# Patient Record
Sex: Female | Born: 1980 | Race: White | Hispanic: No | Marital: Married | Smoking: Current some day smoker
Health system: Southern US, Community
[De-identification: ages and names within clinical notes are randomized; demographics above are authoritative.]

## PROBLEM LIST (undated history)

## (undated) DIAGNOSIS — K2970 Gastritis, unspecified, without bleeding: Secondary | ICD-10-CM

## (undated) DIAGNOSIS — N200 Calculus of kidney: Secondary | ICD-10-CM

## (undated) DIAGNOSIS — F909 Attention-deficit hyperactivity disorder, unspecified type: Secondary | ICD-10-CM

## (undated) DIAGNOSIS — F419 Anxiety disorder, unspecified: Secondary | ICD-10-CM

## (undated) DIAGNOSIS — E039 Hypothyroidism, unspecified: Secondary | ICD-10-CM

## (undated) DIAGNOSIS — E7800 Pure hypercholesterolemia, unspecified: Secondary | ICD-10-CM

## (undated) DIAGNOSIS — F319 Bipolar disorder, unspecified: Secondary | ICD-10-CM

## (undated) DIAGNOSIS — G43909 Migraine, unspecified, not intractable, without status migrainosus: Secondary | ICD-10-CM

## (undated) HISTORY — DX: Calculus of kidney: N20.0

## (undated) HISTORY — PX: EXPLORATORY LAPAROTOMY: SUR591

## (undated) HISTORY — DX: Hypothyroidism, unspecified: E03.9

## (undated) HISTORY — DX: Anxiety disorder, unspecified: F41.9

## (undated) HISTORY — PX: DILITATION & CURRETTAGE/HYSTROSCOPY WITH ESSURE: SHX5573

## (undated) HISTORY — PX: TUBAL LIGATION: SHX77

## (undated) HISTORY — DX: Migraine, unspecified, not intractable, without status migrainosus: G43.909

## (undated) HISTORY — PX: ABLATION COLPOCLESIS: SHX1118

## (undated) HISTORY — DX: Bipolar disorder, unspecified: F31.9

## (undated) HISTORY — PX: OTHER SURGICAL HISTORY: SHX169

## (undated) HISTORY — DX: Gastritis, unspecified, without bleeding: K29.70

## (undated) HISTORY — PX: CHOLECYSTECTOMY: SHX55

---

## 2002-05-11 ENCOUNTER — Emergency Department (HOSPITAL_COMMUNITY): Admission: EM | Admit: 2002-05-11 | Discharge: 2002-05-11 | Payer: Self-pay | Admitting: Emergency Medicine

## 2002-05-23 ENCOUNTER — Inpatient Hospital Stay (HOSPITAL_COMMUNITY): Admission: AD | Admit: 2002-05-23 | Discharge: 2002-05-23 | Payer: Self-pay | Admitting: Family Medicine

## 2002-05-23 ENCOUNTER — Encounter: Payer: Self-pay | Admitting: Obstetrics and Gynecology

## 2007-02-09 ENCOUNTER — Emergency Department (HOSPITAL_COMMUNITY): Admission: EM | Admit: 2007-02-09 | Discharge: 2007-02-09 | Payer: Self-pay | Admitting: Emergency Medicine

## 2007-02-13 ENCOUNTER — Emergency Department (HOSPITAL_COMMUNITY): Admission: EM | Admit: 2007-02-13 | Discharge: 2007-02-13 | Payer: Self-pay | Admitting: Emergency Medicine

## 2007-05-06 ENCOUNTER — Emergency Department (HOSPITAL_COMMUNITY): Admission: EM | Admit: 2007-05-06 | Discharge: 2007-05-06 | Payer: Self-pay | Admitting: Emergency Medicine

## 2007-06-24 ENCOUNTER — Emergency Department (HOSPITAL_COMMUNITY): Admission: EM | Admit: 2007-06-24 | Discharge: 2007-06-24 | Payer: Self-pay | Admitting: Emergency Medicine

## 2007-06-29 ENCOUNTER — Other Ambulatory Visit: Payer: Self-pay | Admitting: Emergency Medicine

## 2007-06-29 ENCOUNTER — Inpatient Hospital Stay (HOSPITAL_COMMUNITY): Admission: RE | Admit: 2007-06-29 | Discharge: 2007-07-02 | Payer: Self-pay | Admitting: Psychiatry

## 2007-06-29 ENCOUNTER — Ambulatory Visit: Payer: Self-pay | Admitting: Psychiatry

## 2007-07-02 ENCOUNTER — Emergency Department (HOSPITAL_COMMUNITY): Admission: EM | Admit: 2007-07-02 | Discharge: 2007-07-03 | Payer: Self-pay | Admitting: Emergency Medicine

## 2007-07-07 ENCOUNTER — Inpatient Hospital Stay (HOSPITAL_COMMUNITY): Admission: RE | Admit: 2007-07-07 | Discharge: 2007-07-12 | Payer: Self-pay | Admitting: *Deleted

## 2007-08-03 ENCOUNTER — Emergency Department (HOSPITAL_COMMUNITY): Admission: EM | Admit: 2007-08-03 | Discharge: 2007-08-03 | Payer: Self-pay | Admitting: Emergency Medicine

## 2007-08-26 ENCOUNTER — Inpatient Hospital Stay (HOSPITAL_COMMUNITY): Admission: AD | Admit: 2007-08-26 | Discharge: 2007-08-26 | Payer: Self-pay | Admitting: Obstetrics & Gynecology

## 2007-10-03 ENCOUNTER — Inpatient Hospital Stay (HOSPITAL_COMMUNITY): Admission: AD | Admit: 2007-10-03 | Discharge: 2007-10-03 | Payer: Self-pay | Admitting: Obstetrics and Gynecology

## 2008-11-13 IMAGING — US US PELVIS COMPLETE MODIFY
1 series · 13 of 25 positions shown · non-contrast
Comparison: CT abdomen and pelvis 08/03/07.

CLINICAL DATA: Pelvic pain and cramping.  Fever.  IUD.
 TRANSABDOMINAL AND TRANSVAGINAL PELVIC ULTRASOUND:
TECHNIQUE: Both transabdominal and transvaginal ultrasound examinations of the pelvis were performed including evaluation of the uterus, ovaries, adnexal regions, and pelvic cul-de-sac.

[Series 1: us pelvis complete modify · 0.28mm/px · 13 of 55 slices shown]
[im 1/55]
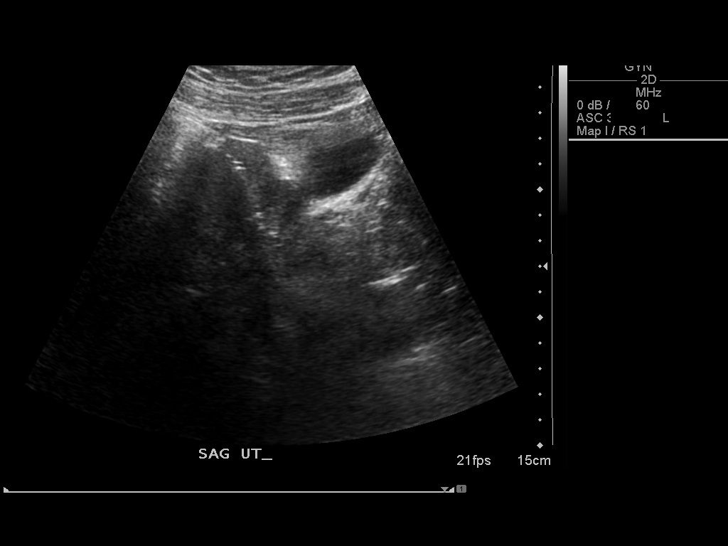
[im 5/55]
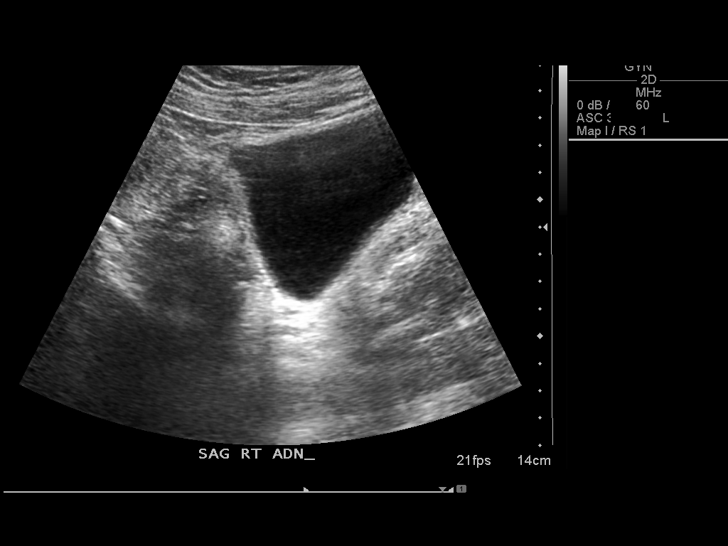
[im 10/55]
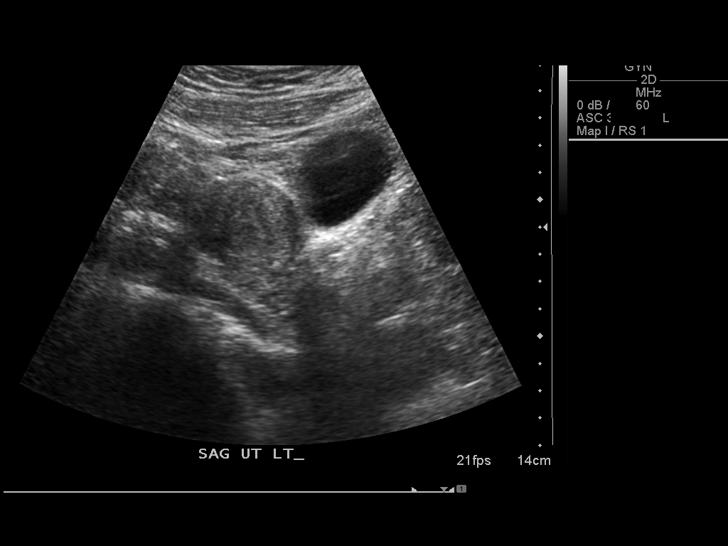
[im 14/55]
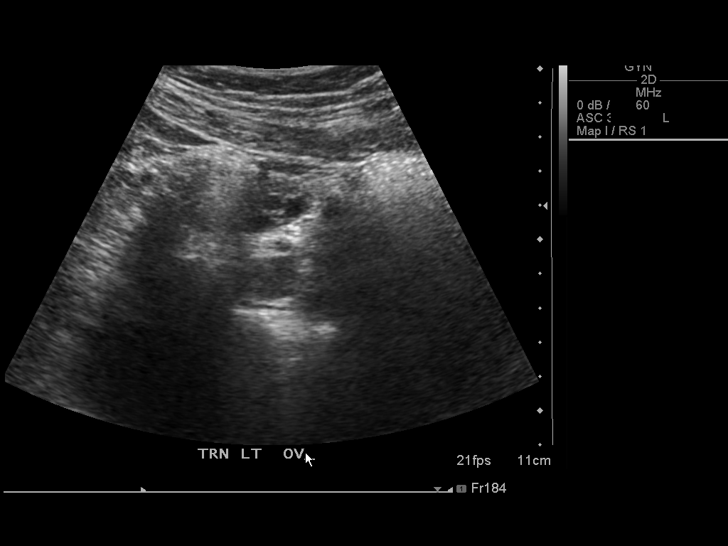
[im 19/55]
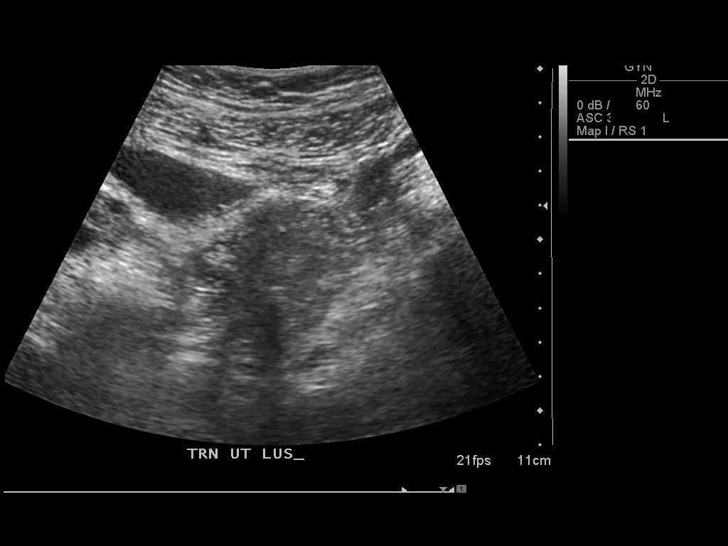
[im 23/55]
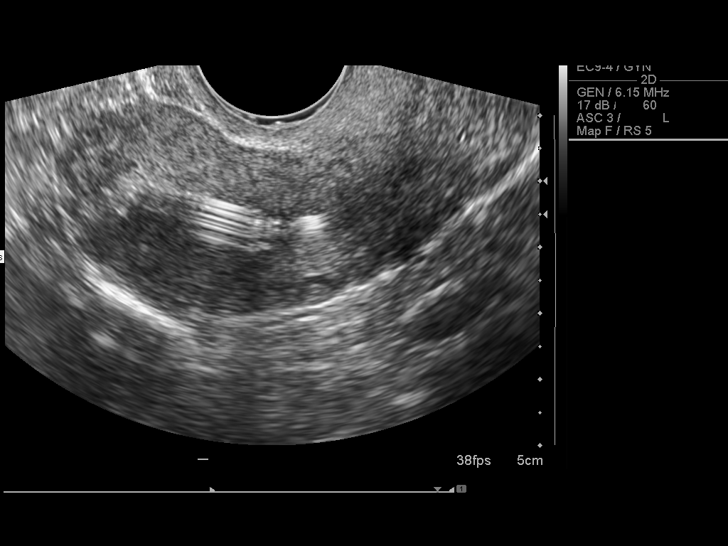
[im 28/55]
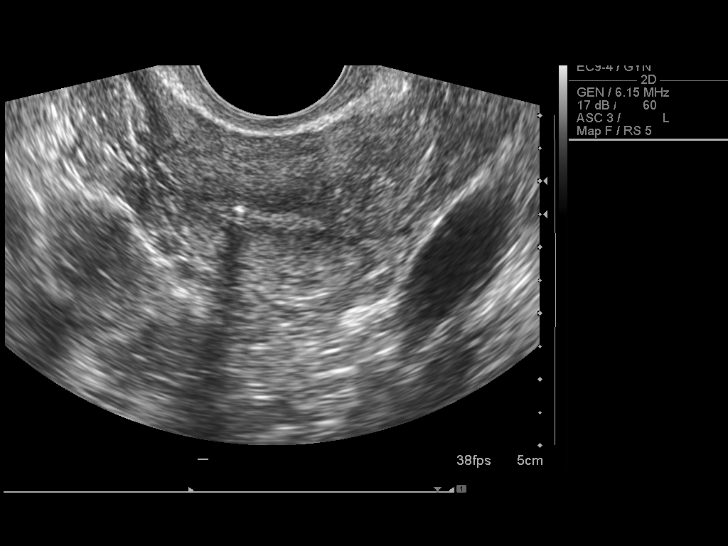
[im 32/55]
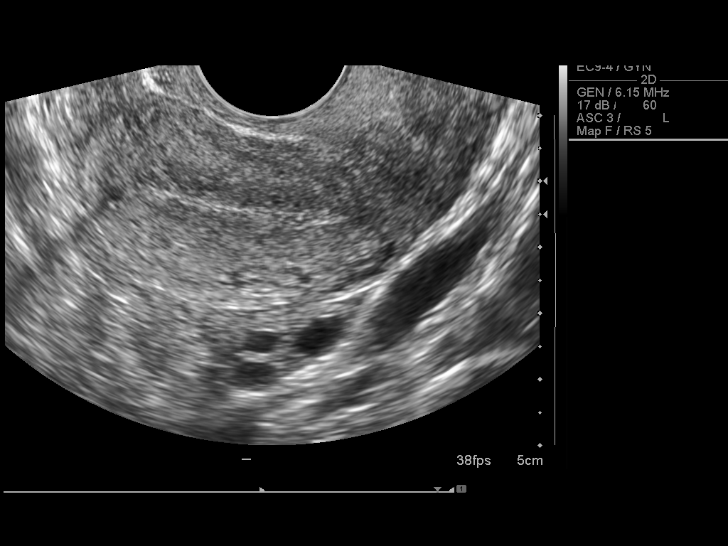
[im 37/55]
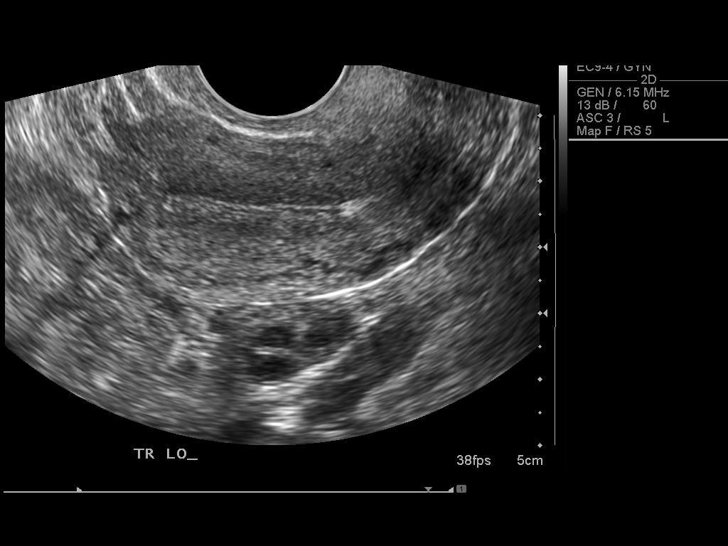
[im 41/55]
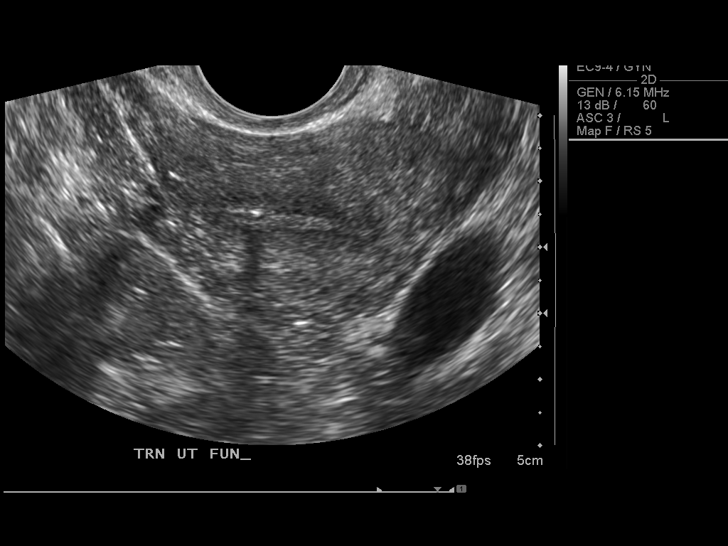
[im 46/55]
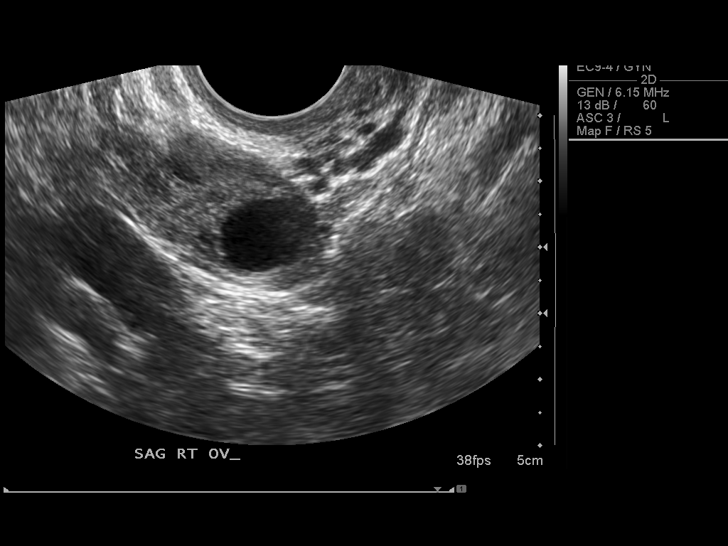
[im 50/55]
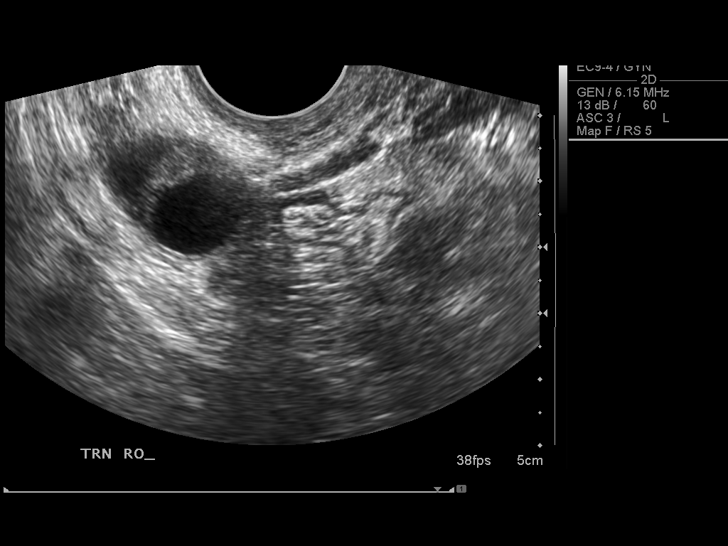
[im 55/55]
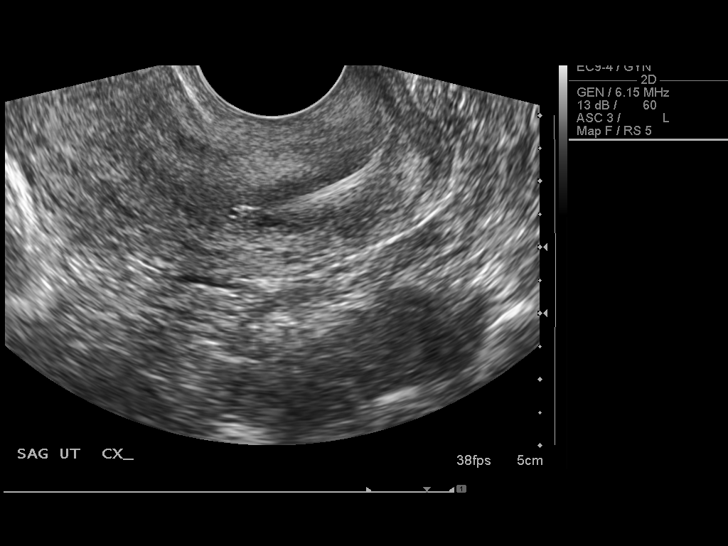

[13 of 25 positions shown; findings below may reference images not displayed]

FINDINGS: The uterus measures approximately 7.2 x 3.3 x 4.6 cm.  Intrauterine device is noted.  The distal end of the echogenic component of this IUD is 6 mm from the external os.  No endometrial thickening or myometrial abnormality is apparent.  The IUD was noted to be thinning the fundal endometrium on the prior CT, although this is not well demonstrated by the current examination.  
 There is no free pelvic fluid.  Both ovaries appear normal, measuring 3.0 x 1.9 x 2.0 cm on the right and 3.2 x 1.3 x 2.0 cm on the left.
IMPRESSION: 1.  No acute pelvic findings are demonstrated.  Both ovaries appear normal.  
 2.  No endometrial abnormality is demonstrated.  Fundal myometrial thinning noted on prior CT is not well demonstrated by this examination.  Please refer to prior CT report; IUD migration is not excluded by this examination.

## 2010-02-16 ENCOUNTER — Ambulatory Visit: Payer: Self-pay | Admitting: Radiology

## 2010-02-16 ENCOUNTER — Emergency Department (HOSPITAL_BASED_OUTPATIENT_CLINIC_OR_DEPARTMENT_OTHER): Admission: EM | Admit: 2010-02-16 | Discharge: 2010-02-16 | Payer: Self-pay | Admitting: Emergency Medicine

## 2010-03-08 ENCOUNTER — Ambulatory Visit: Payer: Self-pay | Admitting: Diagnostic Radiology

## 2010-03-08 ENCOUNTER — Observation Stay (HOSPITAL_COMMUNITY): Admission: EM | Admit: 2010-03-08 | Discharge: 2010-03-09 | Payer: Self-pay | Admitting: Internal Medicine

## 2010-03-08 ENCOUNTER — Encounter: Payer: Self-pay | Admitting: Emergency Medicine

## 2010-03-28 ENCOUNTER — Ambulatory Visit (HOSPITAL_COMMUNITY): Payer: Self-pay | Admitting: Psychiatry

## 2010-04-08 ENCOUNTER — Ambulatory Visit (HOSPITAL_COMMUNITY): Payer: Self-pay | Admitting: Licensed Clinical Social Worker

## 2010-04-16 ENCOUNTER — Ambulatory Visit (HOSPITAL_COMMUNITY): Payer: Self-pay | Admitting: Licensed Clinical Social Worker

## 2010-04-23 ENCOUNTER — Ambulatory Visit (HOSPITAL_COMMUNITY): Payer: Self-pay | Admitting: Licensed Clinical Social Worker

## 2010-05-01 ENCOUNTER — Ambulatory Visit (HOSPITAL_COMMUNITY): Payer: Self-pay | Admitting: Psychiatry

## 2010-05-03 ENCOUNTER — Ambulatory Visit (HOSPITAL_COMMUNITY): Payer: Self-pay | Admitting: Licensed Clinical Social Worker

## 2010-05-06 ENCOUNTER — Ambulatory Visit (HOSPITAL_COMMUNITY): Payer: Self-pay | Admitting: Licensed Clinical Social Worker

## 2010-05-30 ENCOUNTER — Ambulatory Visit (HOSPITAL_COMMUNITY): Payer: Self-pay | Admitting: Licensed Clinical Social Worker

## 2010-06-10 ENCOUNTER — Ambulatory Visit (HOSPITAL_COMMUNITY): Payer: Self-pay | Admitting: Licensed Clinical Social Worker

## 2010-06-11 ENCOUNTER — Ambulatory Visit (HOSPITAL_COMMUNITY): Payer: Self-pay | Admitting: Physician Assistant

## 2010-06-12 ENCOUNTER — Ambulatory Visit (HOSPITAL_COMMUNITY): Payer: Self-pay | Admitting: Licensed Clinical Social Worker

## 2010-06-13 ENCOUNTER — Ambulatory Visit (HOSPITAL_COMMUNITY): Payer: Self-pay | Admitting: Psychiatry

## 2010-07-04 ENCOUNTER — Ambulatory Visit (HOSPITAL_COMMUNITY)
Admission: RE | Admit: 2010-07-04 | Discharge: 2010-07-04 | Payer: Self-pay | Source: Home / Self Care | Attending: Licensed Clinical Social Worker | Admitting: Licensed Clinical Social Worker

## 2010-07-11 ENCOUNTER — Ambulatory Visit (HOSPITAL_COMMUNITY)
Admission: RE | Admit: 2010-07-11 | Discharge: 2010-07-11 | Payer: Self-pay | Source: Home / Self Care | Attending: Licensed Clinical Social Worker | Admitting: Licensed Clinical Social Worker

## 2010-07-16 ENCOUNTER — Ambulatory Visit (HOSPITAL_COMMUNITY)
Admission: RE | Admit: 2010-07-16 | Discharge: 2010-07-16 | Payer: Self-pay | Source: Home / Self Care | Attending: Psychiatry | Admitting: Psychiatry

## 2010-07-18 ENCOUNTER — Ambulatory Visit (HOSPITAL_COMMUNITY)
Admission: RE | Admit: 2010-07-18 | Discharge: 2010-07-18 | Payer: Self-pay | Source: Home / Self Care | Attending: Licensed Clinical Social Worker | Admitting: Licensed Clinical Social Worker

## 2010-07-25 ENCOUNTER — Encounter (HOSPITAL_COMMUNITY): Admitting: Licensed Clinical Social Worker

## 2010-07-25 DIAGNOSIS — F319 Bipolar disorder, unspecified: Secondary | ICD-10-CM

## 2010-08-01 ENCOUNTER — Encounter (HOSPITAL_COMMUNITY): Admitting: Licensed Clinical Social Worker

## 2010-08-01 DIAGNOSIS — F319 Bipolar disorder, unspecified: Secondary | ICD-10-CM

## 2010-08-08 ENCOUNTER — Encounter (HOSPITAL_COMMUNITY): Admitting: Physician Assistant

## 2010-08-08 DIAGNOSIS — F988 Other specified behavioral and emotional disorders with onset usually occurring in childhood and adolescence: Secondary | ICD-10-CM

## 2010-08-08 DIAGNOSIS — F309 Manic episode, unspecified: Secondary | ICD-10-CM

## 2010-08-09 ENCOUNTER — Encounter (HOSPITAL_COMMUNITY): Admitting: Licensed Clinical Social Worker

## 2010-08-09 DIAGNOSIS — F319 Bipolar disorder, unspecified: Secondary | ICD-10-CM

## 2010-08-14 ENCOUNTER — Encounter (HOSPITAL_COMMUNITY): Admitting: Licensed Clinical Social Worker

## 2010-08-30 ENCOUNTER — Emergency Department (HOSPITAL_BASED_OUTPATIENT_CLINIC_OR_DEPARTMENT_OTHER)
Admission: EM | Admit: 2010-08-30 | Discharge: 2010-08-30 | Disposition: A | Discharge status: Home / Self Care | Attending: Emergency Medicine | Admitting: Emergency Medicine

## 2010-08-30 DIAGNOSIS — E0500 Thyrotoxicosis with diffuse goiter without thyrotoxic crisis or storm: Secondary | ICD-10-CM | POA: Insufficient documentation

## 2010-08-30 DIAGNOSIS — F17200 Nicotine dependence, unspecified, uncomplicated: Secondary | ICD-10-CM | POA: Insufficient documentation

## 2010-08-30 DIAGNOSIS — E039 Hypothyroidism, unspecified: Secondary | ICD-10-CM | POA: Insufficient documentation

## 2010-08-30 DIAGNOSIS — E7800 Pure hypercholesterolemia, unspecified: Secondary | ICD-10-CM | POA: Insufficient documentation

## 2010-08-30 DIAGNOSIS — F319 Bipolar disorder, unspecified: Secondary | ICD-10-CM | POA: Insufficient documentation

## 2010-09-03 ENCOUNTER — Encounter (HOSPITAL_COMMUNITY): Admitting: Licensed Clinical Social Worker

## 2010-09-04 ENCOUNTER — Encounter (HOSPITAL_COMMUNITY): Admitting: Licensed Clinical Social Worker

## 2010-09-04 DIAGNOSIS — F319 Bipolar disorder, unspecified: Secondary | ICD-10-CM

## 2010-09-05 LAB — DIFFERENTIAL
Basophils Absolute: 0.1 10*3/uL (ref 0.0–0.1)
Basophils Relative: 1 % (ref 0–1)
Eosinophils Absolute: 0.1 10*3/uL (ref 0.0–0.7)
Eosinophils Relative: 1 % (ref 0–5)
Monocytes Absolute: 0.9 10*3/uL (ref 0.1–1.0)
Monocytes Relative: 7 % (ref 3–12)
Neutro Abs: 8.3 10*3/uL — ABNORMAL HIGH (ref 1.7–7.7)

## 2010-09-05 LAB — CBC
HCT: 41.5 % (ref 36.0–46.0)
Hemoglobin: 12.8 g/dL (ref 12.0–15.0)
Hemoglobin: 14.4 g/dL (ref 12.0–15.0)
MCH: 32.8 pg (ref 26.0–34.0)
MCH: 33.3 pg (ref 26.0–34.0)
MCHC: 34.4 g/dL (ref 30.0–36.0)
MCHC: 34.7 g/dL (ref 30.0–36.0)
MCV: 95.3 fL (ref 78.0–100.0)
MCV: 95.9 fL (ref 78.0–100.0)
Platelets: 381 10*3/uL (ref 150–400)
RDW: 13 % (ref 11.5–15.5)

## 2010-09-05 LAB — BASIC METABOLIC PANEL
BUN: 7 mg/dL (ref 6–23)
CO2: 28 mEq/L (ref 19–32)
GFR calc non Af Amer: >60 mL/min (ref >60)
Glucose, Bld: 81 mg/dL (ref 70–99)
Potassium: 4.3 mEq/L (ref 3.5–5.1)

## 2010-09-05 LAB — URINALYSIS, ROUTINE W REFLEX MICROSCOPIC
Bilirubin Urine: NEGATIVE
Hgb urine dipstick: NEGATIVE
Ketones, ur: NEGATIVE mg/dL
Nitrite: NEGATIVE
pH: 6 (ref 5.0–8.0)

## 2010-09-05 LAB — PREGNANCY, URINE: Preg Test, Ur: NEGATIVE

## 2010-09-06 ENCOUNTER — Encounter (HOSPITAL_COMMUNITY): Admitting: Licensed Clinical Social Worker

## 2010-09-06 DIAGNOSIS — F319 Bipolar disorder, unspecified: Secondary | ICD-10-CM

## 2010-09-09 ENCOUNTER — Encounter (HOSPITAL_COMMUNITY): Admitting: Licensed Clinical Social Worker

## 2010-09-09 DIAGNOSIS — F319 Bipolar disorder, unspecified: Secondary | ICD-10-CM

## 2010-09-10 ENCOUNTER — Encounter (HOSPITAL_COMMUNITY): Admitting: Physician Assistant

## 2010-09-10 DIAGNOSIS — F3160 Bipolar disorder, current episode mixed, unspecified: Secondary | ICD-10-CM

## 2010-09-11 ENCOUNTER — Encounter (HOSPITAL_COMMUNITY): Admitting: Physician Assistant

## 2010-09-12 ENCOUNTER — Encounter (HOSPITAL_COMMUNITY): Admitting: Licensed Clinical Social Worker

## 2010-09-12 DIAGNOSIS — F319 Bipolar disorder, unspecified: Secondary | ICD-10-CM

## 2010-09-16 ENCOUNTER — Encounter (HOSPITAL_COMMUNITY): Admitting: Licensed Clinical Social Worker

## 2010-09-16 DIAGNOSIS — F319 Bipolar disorder, unspecified: Secondary | ICD-10-CM

## 2010-09-25 ENCOUNTER — Encounter (HOSPITAL_BASED_OUTPATIENT_CLINIC_OR_DEPARTMENT_OTHER): Admitting: Licensed Clinical Social Worker

## 2010-09-25 DIAGNOSIS — F319 Bipolar disorder, unspecified: Secondary | ICD-10-CM

## 2010-09-26 ENCOUNTER — Encounter (HOSPITAL_COMMUNITY): Admitting: Physician Assistant

## 2010-09-30 ENCOUNTER — Encounter (HOSPITAL_BASED_OUTPATIENT_CLINIC_OR_DEPARTMENT_OTHER): Admitting: Licensed Clinical Social Worker

## 2010-09-30 DIAGNOSIS — F319 Bipolar disorder, unspecified: Secondary | ICD-10-CM

## 2010-10-03 ENCOUNTER — Emergency Department (INDEPENDENT_AMBULATORY_CARE_PROVIDER_SITE_OTHER)

## 2010-10-03 ENCOUNTER — Emergency Department (HOSPITAL_BASED_OUTPATIENT_CLINIC_OR_DEPARTMENT_OTHER)
Admission: EM | Admit: 2010-10-03 | Discharge: 2010-10-03 | Disposition: A | Discharge status: Home / Self Care | Attending: Emergency Medicine | Admitting: Emergency Medicine

## 2010-10-03 DIAGNOSIS — X500XXA Overexertion from strenuous movement or load, initial encounter: Secondary | ICD-10-CM

## 2010-10-03 DIAGNOSIS — M25579 Pain in unspecified ankle and joints of unspecified foot: Secondary | ICD-10-CM

## 2010-10-03 DIAGNOSIS — F319 Bipolar disorder, unspecified: Secondary | ICD-10-CM | POA: Insufficient documentation

## 2010-10-03 DIAGNOSIS — F17200 Nicotine dependence, unspecified, uncomplicated: Secondary | ICD-10-CM | POA: Insufficient documentation

## 2010-10-03 DIAGNOSIS — Z79899 Other long term (current) drug therapy: Secondary | ICD-10-CM | POA: Insufficient documentation

## 2010-10-03 DIAGNOSIS — E7800 Pure hypercholesterolemia, unspecified: Secondary | ICD-10-CM | POA: Insufficient documentation

## 2010-10-08 ENCOUNTER — Encounter (HOSPITAL_COMMUNITY): Admitting: Physician Assistant

## 2010-10-08 DIAGNOSIS — F3110 Bipolar disorder, current episode manic without psychotic features, unspecified: Secondary | ICD-10-CM

## 2010-10-16 ENCOUNTER — Encounter (HOSPITAL_COMMUNITY): Admitting: Licensed Clinical Social Worker

## 2010-10-27 ENCOUNTER — Emergency Department (HOSPITAL_BASED_OUTPATIENT_CLINIC_OR_DEPARTMENT_OTHER)
Admission: EM | Admit: 2010-10-27 | Discharge: 2010-10-28 | Disposition: A | Discharge status: Home / Self Care | Attending: Emergency Medicine | Admitting: Emergency Medicine

## 2010-10-27 DIAGNOSIS — R112 Nausea with vomiting, unspecified: Secondary | ICD-10-CM | POA: Insufficient documentation

## 2010-10-27 DIAGNOSIS — W108XXA Fall (on) (from) other stairs and steps, initial encounter: Secondary | ICD-10-CM | POA: Insufficient documentation

## 2010-10-27 DIAGNOSIS — L989 Disorder of the skin and subcutaneous tissue, unspecified: Secondary | ICD-10-CM | POA: Insufficient documentation

## 2010-10-27 DIAGNOSIS — F17200 Nicotine dependence, unspecified, uncomplicated: Secondary | ICD-10-CM | POA: Insufficient documentation

## 2010-10-27 DIAGNOSIS — E7800 Pure hypercholesterolemia, unspecified: Secondary | ICD-10-CM | POA: Insufficient documentation

## 2010-10-27 DIAGNOSIS — F319 Bipolar disorder, unspecified: Secondary | ICD-10-CM | POA: Insufficient documentation

## 2010-10-27 DIAGNOSIS — Z79899 Other long term (current) drug therapy: Secondary | ICD-10-CM | POA: Insufficient documentation

## 2010-10-27 DIAGNOSIS — S060X0A Concussion without loss of consciousness, initial encounter: Secondary | ICD-10-CM | POA: Insufficient documentation

## 2010-10-28 ENCOUNTER — Emergency Department (INDEPENDENT_AMBULATORY_CARE_PROVIDER_SITE_OTHER)

## 2010-10-28 DIAGNOSIS — W108XXA Fall (on) (from) other stairs and steps, initial encounter: Secondary | ICD-10-CM

## 2010-10-28 DIAGNOSIS — M549 Dorsalgia, unspecified: Secondary | ICD-10-CM

## 2010-10-28 DIAGNOSIS — R112 Nausea with vomiting, unspecified: Secondary | ICD-10-CM

## 2010-10-28 DIAGNOSIS — R51 Headache: Secondary | ICD-10-CM

## 2010-10-28 DIAGNOSIS — F29 Unspecified psychosis not due to a substance or known physiological condition: Secondary | ICD-10-CM

## 2010-10-30 ENCOUNTER — Encounter (HOSPITAL_COMMUNITY): Admitting: Physician Assistant

## 2010-11-05 NOTE — Discharge Summary (Signed)
NAME:  Christy Obrien, Christy Obrien NO.:  [id number]   MEDICAL RECORD NO.:  [id number]          PATIENT TYPE:  IPS   LOCATION:  0302                          FACILITY:  [facility]   PHYSICIAN:  Jasmine Pang, M.D. DATE OF BIRTH:  09/24/1980   DATE OF ADMISSION:  07/07/2007  DATE OF DISCHARGE:  07/12/2007                               DISCHARGE SUMMARY   IDENTIFICATION:  This is a 30 year old married white female from [place], [place], who was admitted on a voluntary basis on July 07, 2007.  She had just been discharged from our unit 1 week ago.   HISTORY OF PRESENT ILLNESS:  The patient reports a history of confusion.  She states she cannot remember who brought her here and why she is here.  She states she is hearing voices and  is suicidal.  She denies any  specific stressors.  She states that she did speak to her husband the  day prior to this admission.  He is currently deployed in the [employer] and  stationed at [employer].  The patient states she just wants to go to  sleep.  Her appetite has been decreased since.  She denies any alcohol  or drug use.  This is the second visit for this patient.  She has  recently discharged from our facility approximately 1 week ago.  She has  a history of bipolar disorder and was told she has a history of  conversion disorder.  She also has a history of overdosing on her  Neurontin in the past.  She has a history of hypothyroidism.  She is  currently on Abilify 10 mg at bedtime, Klonopin 0.5 mg a.m. and 1 mg at  bedtime, Synthroid 150 mcg at bedtime, Lexapro 10 mg at bedtime,  multivitamin daily, and Estra-C 1 daily.   She is allergic to LAMICTAL, CONTRAST DYE, and BARIUM SULFATE.   PHYSICAL FINDINGS:  Physical exam was grossly within normal limits, but  there were no acute medical or physical problems.   Her T4 was 1.13.  T3 was 96.2.  TSH was elevated at 19.230.  Urine drug  screen was negative.  Glucose was 104.  Urinalysis  showed 2 WBCs.   HOSPITAL COURSE:  Upon admission, the patient was started on Abilify 10  mg p.o. q.h.s., Klonopin 0.5 mg p.o. q.a.m. and 1 mg q.h.s., Synthroid  150 mcg p.o. q.h.s., Lexapro 10 mg p.o. q.h.s., multivitamin 1 daily,  and Estra-C 1 daily.  She was also started on Ambien 5 mg p.o. q.h.s.  p.r.n.  On July 09, 2007, Abilify was increased to 15 mg p.o. daily.  On July 10, 2007, Abilify was decreased back to 10 mg p.o. daily due  to some EPS symptoms.  She was given Benadryl 25 mg now and then p.o.  q.6 hours p.r.n. EPS.  On July 11, 2007, the patient requested that  her Lexapro to be discontinued and the patient tolerated these well with  no significant side effects.  Initially, upon meeting with me she was  anxious.  She stated I do not know to all  our questions.  She was  confused.  She had poor eye contact.  Speech was soft and slow.  She  states she felt tired and confused.  She says she was told she had a  conversion disorder, which is why she had been passing out.  She also  was not able to participate appropriately in unit therapeutic groups and  activities to begin with due to her mental status.  On July 09, 2007,  she was more irritable and angry.  She wanted to go home.  She was angry  because her mother is dissociating herself from [place].  She signed a  72-hour request for discharge.  On July 10, 2007, the patient  continued to be irritable and requesting to go home.  She was angry at  her mother and continued to state she was not going to have anything to  do with her mother.  Her mother, however, has her son and she was  advised that she was going to have some contact with her revolving  around this.  On July 11, 2007, her mental status had improved.  She  states she  thought it has been the wrong decision to come here.  She  did not like the group therapies  once she began to participate in them,  she did not feel they were helpful.  She  refused her Lexapro because she  began to feel very  manic.  She states that when this happened, she  discontinued her Lexapro at home and there was a family session on the  phone with her mother.  The patient's mother spoke about concerns  related to the patient not having supports and being alone.  Upon  discharge, the patient stated she would not go and live with her mother,  even though her mother was offering the support.  The patient's mother  also spoke the patient's history of bipolar and recent onset of visual  hallucinations.  The patient stated she is not experiencing  hallucinations anymore.  Mother expressed a desire for the patient to be  able to live on her own  and hold a job.  They discussed her situation  (her husband is in the [employer]).  She would not talk to her mother and  became tearful when she heard her 49-year-old son speak.  Mother tried to  explain that she had withdrawn somewhat from the picture because she  felt the patient was speaking to her instead of talking to staff and  going to groups.  On July 12, 2007, mental status had improved  markedly from admission status.  The patient was friendly and  cooperative with good eye contact.  Sleep was good.  Appetite good.  Mood was euthymic.  Affect consistent with mood.  There was no suicidal  or homicidal ideation.  No thoughts of self-injurious behavior.  No  auditory or visual hallucinations.  No paranoia or delusions.  Thoughts  were logical and goal-directed.  Thought content, no predominant theme.  Cognitive was grossly back to baseline.  She wanted to go home.  She had  been talking with her mother some and felt safe for discharge.  Her  parents feel it is safe for her to be discharged.  Mother was coming to  pick her up.   DISCHARGE DIAGNOSES:  Axis I:  Bipolar disorder, not otherwise  specified.  Axis II:  Features of borderline personality disorder.  Axis III:  Hypothyroidism.  Axis IV:  Severe  (  psychosocial problems dealing with her husband's  deployment and also problems with husband's family in addition to  problems with her own family and burden of psychiatric illness).  Axis V: Global assessment of function was 50 upon discharge.  Global  assessment of function was 30 upon admission.  Global assessment of  function was 60, highest past year.   DISCHARGE/PLAN:  There were no specific activity level or dietary  restrictions.   POST HOSPITAL CARE PLANS:  The patient will be seen at [facility] on January 20th at 10:35 a.m.  She will follow up with  [facility] at [facility] in 4-6 weeks.   DISCHARGE MEDICATIONS:  1. Levothyroxine 150 mcg one daily.  2. Klonopin 0.5 mg 1 in the a.m. and 2 at bedtime.  3. Lexapro 10 mg is stopped.  4. Abilify 10 mg at bedtime.      Jasmine Pang, M.D.  Electronically Signed     BHS/MEDQ  D:  07/12/2007  T:  07/12/2007  Job:  [id number]

## 2010-11-05 NOTE — H&P (Signed)
NAME:  Christy Obrien, Christy Obrien NO.:  [id number]   MEDICAL RECORD NO.:  [id number]          PATIENT TYPE:  IPS   LOCATION:  0302                          FACILITY:  [facility]   PHYSICIAN:  Jasmine Pang, M.D. DATE OF BIRTH:  1981/01/27   DATE OF ADMISSION:  07/07/2007  DATE OF DISCHARGE:                       PSYCHIATRIC ADMISSION ASSESSMENT   HISTORY OF PRESENT ILLNESS:  The patient reports a history of confusion.  She cannot remember who brought her here, why she is here.  She is  hearing voices, suicidal.  She denies any specific stressors.  She  states that she did speak to her husband the day prior to this admission  who was currently deployed in the [employer] and his stationed at [employer].  The patient states that she just wants to go to sleep.  Her appetite  has been decreased and denies any alcohol or drug use.   PAST PSYCHIATRIC HISTORY:  This is the second visit.  This patient was  recently discharged from our facility approximately 1 week ago.  Has a  history bipolar disorder and was told she has a history of conversion  disorder and history of overdosing on her Neurontin in the past.   SOCIAL HISTORY:  Married female.  Her husband is in the [employer] and  currently stationed in [place], [place].  She has a 54-year-old son.  The son is with the husband's family.  She otherwise lives with her  child.  The patient is unemployed.  Denies any alcohol or drug use.   FAMILY HISTORY:  None.   PRIMARY CARE PHYSICIAN:  [facility] in [place].   PAST MEDICAL HISTORY:  Hypothyroidism.   MEDICATIONS:  1. Abilify 10 mg at bedtime.  2. Klonopin 0.5 mg daily.  3. Klonopin 1 mg at bedtime.  4. Synthroid 150 mcg at bedtime.  5. Lexapro 10 at bedtime.  6. Multivitamin.  7. Estra-C one daily.   ALLERGIES:  LAMICTAL, CONTRAST DYE, BARIUM SULFATE.   REVIEW OF SYSTEMS:  Pertinent for appetite changes, insomnia,  depression, questionable hallucinations, and a  questionable seizure  disorder.   PHYSICAL EXAMINATION:  VITAL SIGNS:  Temperature is 98, heart rate 93,  respirations 80, blood pressure is 113/70.  She is 180 pounds, 5 feet  tall.  GENERAL:  This is a short statured female dressed in scrubs that say  [employer] Wife on the sweat shirt top.  She is disheveled.  Poor eye contact.  HEENT:  Atraumatic.  NECK:  Supple.  No thyromegaly.  CHEST:  Clear.  BREASTS:  Exam was deferred.  HEART:  Regular rate and rhythm.  No murmurs, gallops, rubs.  ABDOMEN:  Soft, nontender abdomen.  PELVIC AND GU:  Deferred.  EXTREMITIES:  The patient moves all extremities.  There is no clubbing,  no edema.  5+ against resistance.  SKIN:  Warm and dry without rashes or lacerations that were noted.  NEUROLOGIC:  Intact.  Her memory is poor.  She responds easily to her  name.  Was a little more interactive with her physical exam than with  her interview.  There is no  tremor, no tics noted.   LABORATORY DATA:  Her T4 is 1.13, T3 is 96.2, TSH is elevated at 19.230.  Urine drug screen is negative.  Glucose 104.  Urinalysis shows  2 WBCs.   MENTAL STATUS EXAM:  Again, during the interview, the patient was alert  and cooperative but with poor eye contact and with psychomotor  retardation.  Her speech is soft-spoken, looking down at the floor.  Offers little in regards to her history.  Her mood is tired and  confused.  The patient's affect is depressed and also confused in  regards to history.  Thought process:  There is questionable  hallucinations or delusions, as she did state on her initial assessment  that she had thought that her child was dead even though he is alive and  also hearing people walking around her house.  Cognitive function:  Her  memory is poor.  Her judgment and insight is poor.  Concentration is  decreased.   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   AXIS II:  Deferred.   AXIS III:  Hypothyroidism.   AXIS IV:   Psychosocial problems dealing with her husband's deployment  and also problems with husband's family.   AXIS V:  Current is 30.   Contract for safety.  We will stabilize mood, thinking.  Will resume her  medications.  The patient will need followup in regards to her thyroid  testing.  That was discussed with the patient, and the patient will  followup in 4-6 weeks.  We will increase coping skills.  Will contact  mother for background information, what prompted admission to the  facility.  Her tentative length of stay is 4-5 days.      Landry Corporal, N.P.      Jasmine Pang, M.D.  Electronically Signed    JO/MEDQ  D:  07/08/2007  T:  07/08/2007  Job:  [id number]

## 2010-11-05 NOTE — H&P (Signed)
NAME:  Christy Obrien, Christy Obrien NO.:  [id number]   MEDICAL RECORD NO.:  [id number]          PATIENT TYPE:  IPS   LOCATION:  0503                          FACILITY:  [facility]   PHYSICIAN:  Jasmine Pang, M.D. DATE OF BIRTH:  07-03-1980   DATE OF ADMISSION:  06/29/2007  DATE OF DISCHARGE:                       PSYCHIATRIC ADMISSION ASSESSMENT   REASON FOR ADMISSION:  A 30 year old female voluntarily admitted June 29, 2007.   HISTORY OF PRESENT ILLNESS:  The patient is here as she states for a  suicide attempt.  She states she was unable to keep living the way she  was.  She overdosed on Monday and overdosed on Neurontin, Unisom and  Klonopin.  States that she was taking a bottle caps full of her  medications over period of time.  These were all her medications.  She  reports that she is feeling very, very depressed.  She is a single mom  as her husband is currently deployed for 6 to 7 months in the [employer].  She reports having panic attacks.  Getting to the point where she cannot  breathe and she passes out, problems sleeping with a satisfactory  appetite.  She reports somewhat of a rocky relationship with her  husband, again who is are currently deployed at this time up in [place], [place].   PAST PSYCHIATRIC HISTORY:  This is her first admission to [facility].  Has a history bipolar disorder.  She sees a Therapist, sports  at [facility].  She reports of overdosing in the past, but  states her husband kept waking her up and she was not hospitalized at  that time.  She in the past has been on Trileptal but had an adverse  effect to that medication.   SOCIAL HISTORY:  Her husband is currently in the military in [place]  and is deployed for 6 to 7 months.  She is married for 5 years, has 41-  year-old son.  Her son is currently with her mother.  The patient has  been taking online college courses.   FAMILY HISTORY:  Grandmother with  depression.  Also reports her mother  has depression but not will admit to it.  alcohol, drug history denies  any drug use.  Refers to some episodes of drinking at times, going out 2  to 3 times a week and then having episodes where she does not have any  drinks for several months.   PRIMARY CARE PHYSICIAN:  None known.   MEDICAL PROBLEMS:  Hypothyroidism.   CURRENT MEDICATIONS:  Current medications are Lexapro.  She takes the  medications off and on for depression and when she finds herself  getting hypomanic she stops her Lexapro, takes Klonopin t.i.d. and  Abilify 5 mg at bedtime and levothyroxine 150 mcg.   DRUG ALLERGIES:  ARE LAMICTAL, REPORTS A RASH, SEROQUEL AND TOPAMAX ALSO  REPORTS RASH.   PHYSICAL EXAMINATION:  GENERAL:  on physical exam, this is a young  female who is mildly unsteady on her feet, disheveled and dressed in  hospital scrubs.  She was fully assessed  at [facility] emergency  department.  VITAL SIGNS:  Temperature 97, 66 heart rate, 18 respirations, blood  pressure is 94/66, 100% saturated, 5 feet 1 inch tall, 175 pounds.   LABORATORY DATA:  Her acetaminophen level less than 5.  Pregnancy test  is negative.  Urinalysis is negative.  Her BUN is 4.  CBC within normal  limits and her urine drug screen is positive for benzodiazepines.   MENTAL STATUS EXAM:  She is fully alert, cooperative.  She is currently  dressed in scrubs, somewhat disheveled, good eye contact.  Her speech is  clear, normal pace and tone.  The patient's mood is depressed.  She is  angry about the [employer].  Her affect is constricted.  Thought process  no evidence of any thought disorder.  Her answers are goal-directed,  coherent.  Cognitive function intact.  Her memory is good.  Judgment and  insight fair.  Concentration is intact.   DIAGNOSES:  AXIS I:  Bipolar disorder, mixed.  AXIS II:  Deferred.  AXIS III:  Hypothyroidism.  AXIS IV:  Other psychosocial problems.  AXIS V:  Current  is 30.  Contracted for safety.   PLAN:  Will stabilize her mood and thinking.  Will clarify her dose of  her levothyroxine; will also obtain a TSH.  The patient is to increase  coping skills.  She may need some individual therapy.  We will continue  with her Abilify at this time.  Patient is also considered to be a fall  risk.  Her tentative length of stay is 4 to 5 days.      Landry Corporal, N.P.      Jasmine Pang, M.D.  Electronically Signed    JO/MEDQ  D:  06/30/2007  T:  06/30/2007  Job:  [id number]

## 2010-11-06 ENCOUNTER — Emergency Department (HOSPITAL_COMMUNITY): Payer: Self-pay

## 2010-11-06 ENCOUNTER — Emergency Department (HOSPITAL_COMMUNITY)
Admission: EM | Admit: 2010-11-06 | Discharge: 2010-11-07 | Disposition: A | Discharge status: Home / Self Care | Payer: Self-pay | Attending: Emergency Medicine | Admitting: Emergency Medicine

## 2010-11-06 DIAGNOSIS — F319 Bipolar disorder, unspecified: Secondary | ICD-10-CM | POA: Insufficient documentation

## 2010-11-06 DIAGNOSIS — E039 Hypothyroidism, unspecified: Secondary | ICD-10-CM | POA: Insufficient documentation

## 2010-11-06 DIAGNOSIS — Z9089 Acquired absence of other organs: Secondary | ICD-10-CM | POA: Insufficient documentation

## 2010-11-06 DIAGNOSIS — E785 Hyperlipidemia, unspecified: Secondary | ICD-10-CM | POA: Insufficient documentation

## 2010-11-06 DIAGNOSIS — R079 Chest pain, unspecified: Secondary | ICD-10-CM | POA: Insufficient documentation

## 2010-11-06 DIAGNOSIS — Z79899 Other long term (current) drug therapy: Secondary | ICD-10-CM | POA: Insufficient documentation

## 2010-11-06 LAB — BASIC METABOLIC PANEL
BUN: 6 mg/dL (ref 6–23)
CO2: 25 mEq/L (ref 19–32)
Calcium: 9.1 mg/dL (ref 8.4–10.5)
Chloride: 101 mEq/L (ref 96–112)
Creatinine, Ser: 0.73 mg/dL (ref 0.4–1.2)
Glucose, Bld: 78 mg/dL (ref 70–99)

## 2010-11-06 LAB — CBC
HCT: 42.5 % (ref 36.0–46.0)
MCH: 31.7 pg (ref 26.0–34.0)
MCV: 93 fL (ref 78.0–100.0)
Platelets: 353 10*3/uL (ref 150–400)
RDW: 14.1 % (ref 11.5–15.5)
WBC: 11.6 10*3/uL — ABNORMAL HIGH (ref 4.0–10.5)

## 2010-11-07 LAB — DIFFERENTIAL
Basophils Relative: 1 % (ref 0–1)
Eosinophils Relative: 2 % (ref 0–5)
Lymphs Abs: 6 10*3/uL — ABNORMAL HIGH (ref 0.7–4.0)
Monocytes Absolute: 0.8 10*3/uL (ref 0.1–1.0)
Monocytes Relative: 7 % (ref 3–12)

## 2010-11-07 LAB — PATHOLOGIST SMEAR REVIEW

## 2010-11-07 LAB — POCT CARDIAC MARKERS
CKMB, poc: <1 ng/mL — ABNORMAL LOW (ref 1.0–8.0)
Troponin i, poc: <0.05 ng/mL (ref 0.00–0.09)

## 2010-11-08 NOTE — Discharge Summary (Signed)
NAME:  Christy Obrien, Christy Obrien NO.:  [id number]   MEDICAL RECORD NO.:  [id number]          PATIENT TYPE:  IPS   LOCATION:  0503                          FACILITY:  [facility]   PHYSICIAN:  Jasmine Pang, M.D. DATE OF BIRTH:  [birth year]   DATE OF ADMISSION:  06/29/2007  DATE OF DISCHARGE:  07/02/2007                               DISCHARGE SUMMARY   IDENTIFICATION:  This is a 30 year old married white female who was  admitted on a voluntary basis on June 29, 2007.   HISTORY OF PRESENT ILLNESS:  The patient is here, she states, for a  suicide attempt.  She states she has been unable to keep living the way  she was.  She overdosed on the day prior to admission on Neurontin,  Unisom, and Klonopin.  She states that she has been taking bottle capful  of her medications over a period of time.  These were all her  medications.  She reports she is feeling very, very depressed.  She is a  single mother as her husband is currently deployed for 6-7 months in the  [employer].  She reports having panic attacks.  She has been getting to  the point where she cannot breathe and passes out.  She has problems  sleeping with a satisfactory appetite.  She reports somewhat of a rocky  relationship with her husband again, who is currently deployed at this  time up in [place], [place].  This is her first admission to  [facility].  She has a history of bipolar disorder.  She  sees a Therapist, sports in [facility].  She reports of  overdosing in the past, but states her husband kept waking her up and  she was not hospitalized at that time.  She in the past has been on  Trileptal, but had an adverse effect to this medication.  She has  hypothyroidism.  She is currently on levothyroxine 150 mcg daily.  She  also was on Lexapro and Abilify.  She states she stops the Lexapro  whenever she feels herself getting too manic.   She is allergic to LAMICTAL (a rash), SEROQUEL and  TOPAMAX (also reports  a rash).   PHYSICAL FINDINGS:  On physical exam, the patient had no acute physical  or medical problems.  She was fully assessed at the [facility] ED.   Her acetaminophen level was less than 5.  Pregnancy test was negative.  Urinalysis was negative.  Her BUN was 4.  CBC was within normal limits.  Urine drug screen was positive for benzodiazepines.   HOSPITAL COURSE:  Upon admission, the patient was continued on her  levothyroxine 150 mcg p.o. daily.  She was also continued on her Abilify  5 mg p.o. nightly and Klonopin 0.5 mg p.o. t.i.d. p.r.n.  She tolerated  her medications well with no significant side effects.  In individual  sessions with me, the patient was reserved, but cooperative.  She  discussed the difficulty of having husband in the [employer].  She has a 40-  year-old son.  She states she has bipolar disorder,  which has been  treated with numerous medications in the past.  She also has panic  attacks, treated with clonazepam.  She states there is a positive family  history of mood disorders.  As hospitalization progressed, the patient  continued to have somewhat dysphoric mood.  She discussed conflict with  her in-laws.  She states they do not like me.  She discussed a  relationship with her husband.  She was ambivalent about him.  She sees  him as supportive, but they are also arguing.  On July 02, 2007,  mental status had improved markedly from admission status.  The  patient's sleep was good.  Her appetite was good.  Mood was less  depressed, less anxious.  Affect was consistent with mood.  There was no  suicidal or homicidal ideation.  No thoughts of self-injurious behavior.  No auditory or visual hallucinations.  No paranoia or delusions.  Thoughts were logical and goal-directed.  Thought content, no  predominant theme.  Cognitive was grossly back to baseline.  It was felt  the patient was ready for discharge and she was excited about this.   Her  parents planned to pick her up.   DISCHARGE DIAGNOSES:  Axis I:  Bipolar disorder, mixed.  Axis II:  None.  Axis III:  Hypothyroidism.  Axis IV:  Psychosocial problems, burden of psychiatric illness, and  burden of medical illness.  Axis V:  Global assessment of functioning 48 upon discharge and 30 upon  admission. Global assessment of functioning was 60, highest past year.   DISCHARGE PLANS:  There are no specific activity level or dietary  restrictions.   POST-HOSPITAL CARE PLANS:  The patient will be seen at [facility]  in [facility] on July 06, 2007, at 10:30 a.m.   DISCHARGE MEDICATIONS:  Abilify 5 mg at bedtime and Synthroid 150 mcg  daily.  She was not taking her Lexapro because she felt it was making  her manic.      Jasmine Pang, M.D.  Electronically Signed     BHS/MEDQ  D:  07/13/2007  T:  07/14/2007  Job:  [id number]

## 2010-12-16 ENCOUNTER — Encounter: Payer: Self-pay | Admitting: Family Medicine

## 2010-12-16 ENCOUNTER — Ambulatory Visit (INDEPENDENT_AMBULATORY_CARE_PROVIDER_SITE_OTHER): Payer: Self-pay | Admitting: Family Medicine

## 2010-12-16 ENCOUNTER — Other Ambulatory Visit: Payer: Self-pay | Admitting: Family Medicine

## 2010-12-16 DIAGNOSIS — F988 Other specified behavioral and emotional disorders with onset usually occurring in childhood and adolescence: Secondary | ICD-10-CM | POA: Insufficient documentation

## 2010-12-16 DIAGNOSIS — G43909 Migraine, unspecified, not intractable, without status migrainosus: Secondary | ICD-10-CM

## 2010-12-16 DIAGNOSIS — E785 Hyperlipidemia, unspecified: Secondary | ICD-10-CM | POA: Insufficient documentation

## 2010-12-16 DIAGNOSIS — F3181 Bipolar II disorder: Secondary | ICD-10-CM

## 2010-12-16 DIAGNOSIS — E039 Hypothyroidism, unspecified: Secondary | ICD-10-CM | POA: Insufficient documentation

## 2010-12-16 DIAGNOSIS — F3189 Other bipolar disorder: Secondary | ICD-10-CM

## 2010-12-16 DIAGNOSIS — E669 Obesity, unspecified: Secondary | ICD-10-CM

## 2010-12-16 MED ORDER — LEVOTHYROXINE SODIUM 150 MCG PO TABS: 150.0000 ug | ORAL_TABLET | Freq: Every day | ORAL | Status: DC

## 2010-12-16 MED ORDER — ZAFIRLUKAST 20 MG PO TABS: 20.0000 mg | ORAL_TABLET | Freq: Two times a day (BID) | ORAL | Status: DC

## 2010-12-16 MED ORDER — ESOMEPRAZOLE MAGNESIUM 40 MG PO CPDR: 40.0000 mg | DELAYED_RELEASE_CAPSULE | Freq: Every day | ORAL | Status: DC

## 2010-12-16 MED ORDER — ARIPIPRAZOLE 15 MG PO TABS: 15.0000 mg | ORAL_TABLET | Freq: Every day | ORAL | Status: DC

## 2010-12-16 MED ORDER — VERAPAMIL HCL ER 120 MG PO TBCR: 120.0000 mg | EXTENDED_RELEASE_TABLET | Freq: Every day | ORAL | Status: DC

## 2010-12-16 MED ORDER — HYDROXYZINE HCL 50 MG PO TABS: 50.0000 mg | ORAL_TABLET | Freq: Three times a day (TID) | ORAL | Status: AC | PRN

## 2010-12-16 MED ORDER — SIMVASTATIN 20 MG PO TABS: 20.0000 mg | ORAL_TABLET | Freq: Every day | ORAL | Status: DC

## 2010-12-16 MED ORDER — GABAPENTIN 100 MG PO CAPS: 100.0000 mg | ORAL_CAPSULE | Freq: Every day | ORAL | Status: DC

## 2010-12-16 NOTE — Patient Instructions (Signed)
Nice to meet you both Please get all the paperwork into [facility] as soon as you can. Once we are covered I do want you to come back and have your labs drawn.  You can come in anytime after 830am, there may be a little of a wait. I will give you your levothyroxine but at 150 at this time.  I want to see you again in 1 month to make sure you are doing all right.

## 2010-12-17 ENCOUNTER — Encounter: Payer: Self-pay | Admitting: Family Medicine

## 2010-12-17 NOTE — Assessment & Plan Note (Signed)
Has strong family hx of mixed hyperlipedemia.  Will get FLP once pt is able to get Christy Obrien.

## 2010-12-17 NOTE — Progress Notes (Signed)
  Subjective:    Patient ID: Christy Obrien, female    DOB: Feb 23, 1981, 30 y.o.   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 Pt is seen by a therapist who is treating her bipolar, migraines and ADHD with hydroxizine, abilify, gabapentin, verapamil, and adderall. Pt states she is feeling very stable at present moment.  Pt though has not been seen for her thyroid for quite sometime, and does not remember when the last time she had her levels checked. Pt has not taken any of her synthroid for about 3 months and is starting to notice severe fatigue, weight gain, and maybe a little lower extremity swelling.  Pt denies any hair loss or any confusion.  Pt is accompanied by her mother who is very clear that pt has not had any health insurance and has many health related bills at this time and states that "if we do any unnecessary tests then we will end up at the bottom of the pile".  I told her this would not be our intention but with some her meds we do need certain tests.  Pt is aware of [facility] but have not filled out paperwork yet.  Pt bipolar seems well controlled at moment have not been admitted to inpt for over 2 years since she was in [place].  Now living here alone but mom lives close and helps out a lot.  She does live a boyfriend, is sexually active but uses protection and has had essur done.    Review of Systems Denies fever, chills, nausea vomiting abdominal pain, dysuria, chest pain, shortness of breath dyspnea on exertion or numbness in extremities     Objective:   Physical Exam BP 112/78  Pulse 68  Temp(Src) 98 F (36.7 C)  (Oral)  Ht 5' 1.75" (1.568 m)  Wt 173 lb 11.2 oz (78.79 kg)  BMI 32.03 kg/m2 General appearance: alert and cooperative obese.  Eyes: conjunctivae/corneas clear. PERRL, EOM's intact. Fundi benign. Ears: normal TM's and external ear canals both ears Lungs: clear to auscultation bilaterally Heart: regular rate and rhythm, S1, S2 normal, no murmur, click, rub or gallop Abdomen: soft, non-tender; bowel sounds normal; no masses,  no organomegaly Extremities: extremities normal, atraumatic, no cyanosis or edema Pulses: 2+ and symmetric        Assessment & Plan:

## 2010-12-17 NOTE — Assessment & Plan Note (Signed)
Pt is overweight talked about diet and exercise could potentially help with some of the mental condition as well, especially the anxiety component.

## 2010-12-17 NOTE — Assessment & Plan Note (Signed)
Refilled synthroid but lower dose without having any labs, told to look out for side effects, need labs to to treat properly. Pt is supposed to get labs when [facility] funding comes through.

## 2010-12-17 NOTE — Assessment & Plan Note (Signed)
Pt is given meds through psychiatrists.

## 2010-12-18 ENCOUNTER — Other Ambulatory Visit: Payer: Self-pay

## 2010-12-18 DIAGNOSIS — E039 Hypothyroidism, unspecified: Secondary | ICD-10-CM

## 2010-12-18 DIAGNOSIS — F3189 Other bipolar disorder: Secondary | ICD-10-CM

## 2010-12-18 DIAGNOSIS — E669 Obesity, unspecified: Secondary | ICD-10-CM

## 2010-12-18 LAB — LIPID PANEL
Cholesterol: 313 mg/dL — ABNORMAL HIGH (ref 0–200)
Total CHOL/HDL Ratio: 7 Ratio
Triglycerides: 209 mg/dL — ABNORMAL HIGH (ref <150)

## 2010-12-18 NOTE — Progress Notes (Signed)
CMP,FLP AND TSH DONE TODAY Christy Obrien 

## 2010-12-19 LAB — COMPREHENSIVE METABOLIC PANEL
Albumin: 4.6 g/dL (ref 3.5–5.2)
Alkaline Phosphatase: 60 U/L (ref 39–117)
BUN: 8 mg/dL (ref 6–23)
Calcium: 9.6 mg/dL (ref 8.4–10.5)
Chloride: 104 mEq/L (ref 96–112)
Glucose, Bld: 86 mg/dL (ref 70–99)
Potassium: 4.5 mEq/L (ref 3.5–5.3)

## 2010-12-23 ENCOUNTER — Telehealth: Payer: Self-pay | Admitting: Family Medicine

## 2010-12-23 MED ORDER — ATORVASTATIN CALCIUM 40 MG PO TABS: 40.0000 mg | ORAL_TABLET | Freq: Every day | ORAL | Status: DC

## 2010-12-23 NOTE — Telephone Encounter (Signed)
Message copied by Judi Saa on Mon Dec 23, 2010 12:28 PM ------      Message from: Denny Levy L      Created: Mon Dec 23, 2010  9:31 AM                   ----- Message -----         From: [facility] [place] Interface         Sent: 12/18/2010  11:06 PM           To: Denny Levy, MD

## 2010-12-23 NOTE — Telephone Encounter (Signed)
Called left message that pt will need to change to lipitor. Sent in new prescription, told to stop the simvistain once get lipitor.

## 2010-12-24 ENCOUNTER — Emergency Department (HOSPITAL_BASED_OUTPATIENT_CLINIC_OR_DEPARTMENT_OTHER)
Admission: EM | Admit: 2010-12-24 | Discharge: 2010-12-24 | Disposition: A | Discharge status: Home / Self Care | Payer: Self-pay | Attending: Emergency Medicine | Admitting: Emergency Medicine

## 2010-12-24 DIAGNOSIS — M62838 Other muscle spasm: Secondary | ICD-10-CM | POA: Insufficient documentation

## 2010-12-24 DIAGNOSIS — E785 Hyperlipidemia, unspecified: Secondary | ICD-10-CM | POA: Insufficient documentation

## 2010-12-24 DIAGNOSIS — F319 Bipolar disorder, unspecified: Secondary | ICD-10-CM | POA: Insufficient documentation

## 2010-12-24 DIAGNOSIS — E039 Hypothyroidism, unspecified: Secondary | ICD-10-CM | POA: Insufficient documentation

## 2010-12-24 DIAGNOSIS — R1013 Epigastric pain: Secondary | ICD-10-CM | POA: Insufficient documentation

## 2010-12-24 DIAGNOSIS — J45909 Unspecified asthma, uncomplicated: Secondary | ICD-10-CM | POA: Insufficient documentation

## 2010-12-24 LAB — COMPREHENSIVE METABOLIC PANEL
AST: 16 U/L (ref 0–37)
Albumin: 4.1 g/dL (ref 3.5–5.2)
Alkaline Phosphatase: 67 U/L (ref 39–117)
Chloride: 102 mEq/L (ref 96–112)
Creatinine, Ser: 0.8 mg/dL (ref 0.50–1.10)
Potassium: 3.6 mEq/L (ref 3.5–5.1)
Total Bilirubin: 0.6 mg/dL (ref 0.3–1.2)

## 2010-12-24 LAB — PREGNANCY, URINE: Preg Test, Ur: NEGATIVE

## 2010-12-24 LAB — DIFFERENTIAL
Basophils Absolute: 0.1 10*3/uL (ref 0.0–0.1)
Basophils Relative: 1 % (ref 0–1)
Eosinophils Absolute: 0.2 10*3/uL (ref 0.0–0.7)
Eosinophils Relative: 2 % (ref 0–5)

## 2010-12-24 LAB — URINALYSIS, ROUTINE W REFLEX MICROSCOPIC
Leukocytes, UA: NEGATIVE
Protein, ur: NEGATIVE mg/dL
Urobilinogen, UA: 0.2 mg/dL (ref 0.0–1.0)

## 2010-12-24 LAB — CBC
Platelets: 318 10*3/uL (ref 150–400)
RDW: 15.4 % (ref 11.5–15.5)
WBC: 9.5 10*3/uL (ref 4.0–10.5)

## 2010-12-30 ENCOUNTER — Telehealth: Payer: Self-pay | Admitting: Family Medicine

## 2010-12-30 NOTE — Telephone Encounter (Signed)
Pt is wanting to pick up a copy of labs from last week.  Wants to know what the results are.  She wants to come by about 12:00 today

## 2010-12-30 NOTE — Telephone Encounter (Signed)
Pt informed of the results and that new Rx called in (Per MD)  Will place copy up front for pt to pick up Christy Obrien, Christy Obrien

## 2011-01-14 ENCOUNTER — Encounter: Payer: Self-pay | Admitting: Family Medicine

## 2011-01-14 ENCOUNTER — Ambulatory Visit (INDEPENDENT_AMBULATORY_CARE_PROVIDER_SITE_OTHER): Payer: Self-pay | Admitting: Family Medicine

## 2011-01-14 DIAGNOSIS — E785 Hyperlipidemia, unspecified: Secondary | ICD-10-CM

## 2011-01-14 DIAGNOSIS — E669 Obesity, unspecified: Secondary | ICD-10-CM

## 2011-01-14 DIAGNOSIS — E039 Hypothyroidism, unspecified: Secondary | ICD-10-CM

## 2011-01-14 DIAGNOSIS — Z23 Encounter for immunization: Secondary | ICD-10-CM

## 2011-01-14 MED ORDER — LEVOTHYROXINE SODIUM 175 MCG PO TABS: 175.0000 ug | ORAL_TABLET | Freq: Every day | ORAL | Status: DC

## 2011-01-14 MED ORDER — CETIRIZINE HCL 10 MG PO CHEW: 10.0000 mg | CHEWABLE_TABLET | Freq: Every day | ORAL | Status: DC

## 2011-01-14 MED ORDER — CETIRIZINE HCL 10 MG PO TABS: 10.0000 mg | ORAL_TABLET | Freq: Every day | ORAL | Status: DC

## 2011-01-14 NOTE — Assessment & Plan Note (Signed)
Will encourage weight loss and will give exercises to start at next visit.   Pt states she will try to improve.

## 2011-01-14 NOTE — Progress Notes (Signed)
  Subjective:    Patient ID: Christy Obrien, female    DOB: 1981-04-05, 30 y.o.   MRN: [id number]  HPI  Pt is here for f/u on  1.  Thyroid-  Started on 150 of levothyroxine improved but still tired, pt TSH was significantly elevated and likely under dosed, has improved.   2. HLD-  Pt had significant elevation in cholesterol panel, has family hx of type 1 hyperlipidemia, pt not on lipitor yet due to financial constraints but should be getting the medicine in the next few days.   3. Pt is doing very well with her bipolar and will continue current meds and seeing behavioral health.   4.  Obesity-  Pt states has not started to workout yet and has not tried to change her diet, trying to change all her meds first and then will start to make changes.   5.  Pt is having cough, usually has this at this time of year, did have a stomach virus yesterday and is improved tried flonase in the past and made her lose her voice.  Review of Systems Denies fever, chills, nausea vomiting abdominal pain, dysuria, chest pain, shortness of breath dyspnea on exertion or numbness in extremities Past medical history, social, surgical and family history all reviewed.      Objective:   Physical Exam     General appearance: alert and cooperative obese.  Eyes: conjunctivae/corneas clear. PERRL, EOM's intact. Fundi benign. Ears: normal TM's and external ear canals both ears [place]: +PND Lungs: clear to auscultation bilaterally Heart: regular rate and rhythm, S1, S2 normal, no murmur, click, rub or gallop Abdomen: soft, non-tender; bowel sounds normal; no masses,  no organomegaly Extremities: extremities normal, atraumatic, no cyanosis or edema Pulses: 2+ and symmetric   Assessment & Plan:

## 2011-01-14 NOTE — Assessment & Plan Note (Signed)
will increase to 175mg  daily due to fatigue

## 2011-01-14 NOTE — Assessment & Plan Note (Signed)
Encourage pt to get Lipitor asap, will get labs FLP.

## 2011-01-14 NOTE — Patient Instructions (Signed)
Good to see you I will increase your synthroid to 175mg  I will start zyrtec as well for your allergies and post nasal drip I want to see you again in 2-3 months and come in 1 week earlier to have your blood drawn so we can see how the medicines are doing.

## 2011-01-14 NOTE — Progress Notes (Signed)
Addended by: Garen Grams F on: 01/14/2011 03:35 PM   Modules accepted: Orders

## 2011-03-06 ENCOUNTER — Encounter: Payer: Self-pay | Admitting: Family Medicine

## 2011-03-06 ENCOUNTER — Ambulatory Visit (INDEPENDENT_AMBULATORY_CARE_PROVIDER_SITE_OTHER): Payer: Self-pay | Admitting: Family Medicine

## 2011-03-06 VITALS — BP 110/78 | HR 80 | Temp 98.1°F | Wt 173.7 lb

## 2011-03-06 DIAGNOSIS — J209 Acute bronchitis, unspecified: Secondary | ICD-10-CM

## 2011-03-06 MED ORDER — AZITHROMYCIN 250 MG PO TABS: ORAL_TABLET | ORAL | Status: AC

## 2011-03-06 NOTE — Patient Instructions (Addendum)
You have a bronchitis, either bacteria or virus.  Take azithromycin as directed. Call if unable to get this at pharmacy.  Try gargling with salt water or motrin for sore throat.  You may try phenergan for nausea. If not better in 3-4 days or still having fevers come back to clinic.  Bronchitis Bronchitis is the body's way of reacting to injury and/or infection (inflammation) of the bronchi. Bronchi are the air tubes that extend from the windpipe into the lungs. If the inflammation becomes severe, it may cause shortness of breath.  CAUSES Inflammation may be caused by:  A virus.   Germs (bacteria).   Dust.   Allergens.   Pollutants and many other irritants.  The cells lining the bronchial tree are covered with tiny hairs (cilia). These constantly beat upward, away from the lungs, toward the mouth. This keeps the lungs free of pollutants. When these cells become too irritated and are unable to do their job, mucus begins to develop. This causes the characteristic cough of bronchitis. The cough clears the lungs when the cilia are unable to do their job. Without either of these protective mechanisms, the mucus would settle in the lungs. Then you would develop pneumonia. Smoking is a common cause of bronchitis and can contribute to pneumonia. Stopping this habit is the single most important thing you can do to help yourself. TREATMENT  Your caregiver may prescribe an antibiotic if the cough is caused by bacteria. Also, medicines that open up your airways make it easier to breathe. Your caregiver may also recommend or prescribe an expectorant. It will loosen the mucus to be coughed up. Only take over-the-counter or prescription medicines for pain, discomfort, or fever as directed by your caregiver.   Removing whatever causes the problem (smoking, for example) is critical to preventing the problem from getting worse.   Cough suppressants may be prescribed for relief of cough symptoms.    Inhaled medicines may be prescribed to help with symptoms now and to help prevent problems from returning.   For those with recurrent (chronic) bronchitis, there may be a need for steroid medicines.  SEEK IMMEDIATE MEDICAL CARE IF:  During treatment, you develop more pus-like mucus (purulent sputum).   You or your child has an oral temperature above101, not controlled by medicine.   Your baby is older than 3 months with a rectal temperature of 102 F (38.9 C) or higher.   Your baby is 53 months old or younger with a rectal temperature of 100.4 F (38 C) or higher.   You become progressively more ill.   You have increased difficulty breathing, wheezing, or shortness of breath.  It is necessary to seek immediate medical care if you are elderly or sick from any other disease. MAKE SURE YOU:  Understand these instructions.   Will watch your condition.   Will get help right away if you are not doing well or get worse.  Document Released: 06/09/2005 Document Re-Released: 09/03/2009 [facility] Patient Information 2011 [place], [place].

## 2011-03-07 NOTE — Assessment & Plan Note (Signed)
Symptoms for 10 days, worsening with onset of fevers at home (not noted today). No focal lung findings to suggest pneumonia, but likely a bronchitis secondary to viral vs atypical bacterial causes. Will treat for atypical pathogens with azithromycin. Continue supportive care with motrin and rest, plenty of fluids. Patient to follow up in next 3-5 days if not improving.

## 2011-03-07 NOTE — Progress Notes (Signed)
  Subjective:    Patient ID: Christy Obrien, female    DOB: 1980/08/09, 30 y.o.   MRN: [id number]  HPI  1. Fever, cough, fatigue. Symptoms began with a dry cough 10 days ago. Associated with nasal congestion, body aches, fatigue, chest soreness, mild wheezing intermittently. Symptoms were stable but 3 days ago began having fevers up to 102 degrees. Has also developed some nausea and post-tussive emesis. She is drinking fluids and keeping them down. Denies chest pain, abdominal pain, hemoptysis, swelling, rash, facial pain, teeth pain, HA.   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 She is a current 1 ppd smoker.  Review of Systems See HPI otherwise negative.     Objective:   Physical Exam  Vitals reviewed. Constitutional: She is oriented to person, place, and time. She appears well-developed and well-nourished. No distress.  HENT:  Head: Normocephalic and atraumatic.  Right Ear: External ear normal.  Left Ear: External ear normal.  Nose: Nose normal.  Mouth/Throat: Oropharynx is clear and moist. No oropharyngeal exudate.       No sinus tenderness or pain.   Eyes: EOM are normal. Pupils are equal, round, and reactive to light.  Cardiovascular: Normal rate, regular rhythm and normal heart sounds.   No murmur heard. Pulmonary/Chest: Effort normal. No stridor. No respiratory distress. She has no wheezes. She has no rales.       Coarse breath sounds throughout.  Abdominal: Soft. She exhibits no distension. There is no tenderness.  Lymphadenopathy:    She has no cervical adenopathy.  Neurological: She is alert and oriented to person, place, and time.  Psychiatric: She has a normal mood and affect.          Assessment & Plan:

## 2011-03-09 ENCOUNTER — Emergency Department (HOSPITAL_BASED_OUTPATIENT_CLINIC_OR_DEPARTMENT_OTHER)
Admission: EM | Admit: 2011-03-09 | Discharge: 2011-03-09 | Disposition: A | Discharge status: Home / Self Care | Payer: Self-pay | Attending: Emergency Medicine | Admitting: Emergency Medicine

## 2011-03-09 ENCOUNTER — Encounter (HOSPITAL_BASED_OUTPATIENT_CLINIC_OR_DEPARTMENT_OTHER): Payer: Self-pay | Admitting: *Deleted

## 2011-03-09 ENCOUNTER — Emergency Department (INDEPENDENT_AMBULATORY_CARE_PROVIDER_SITE_OTHER): Payer: Self-pay

## 2011-03-09 DIAGNOSIS — J45909 Unspecified asthma, uncomplicated: Secondary | ICD-10-CM | POA: Insufficient documentation

## 2011-03-09 DIAGNOSIS — E039 Hypothyroidism, unspecified: Secondary | ICD-10-CM | POA: Insufficient documentation

## 2011-03-09 DIAGNOSIS — Z79899 Other long term (current) drug therapy: Secondary | ICD-10-CM | POA: Insufficient documentation

## 2011-03-09 DIAGNOSIS — M549 Dorsalgia, unspecified: Secondary | ICD-10-CM | POA: Insufficient documentation

## 2011-03-09 DIAGNOSIS — F319 Bipolar disorder, unspecified: Secondary | ICD-10-CM | POA: Insufficient documentation

## 2011-03-09 DIAGNOSIS — E785 Hyperlipidemia, unspecified: Secondary | ICD-10-CM | POA: Insufficient documentation

## 2011-03-09 DIAGNOSIS — M533 Sacrococcygeal disorders, not elsewhere classified: Secondary | ICD-10-CM

## 2011-03-09 LAB — URINALYSIS, ROUTINE W REFLEX MICROSCOPIC
Glucose, UA: NEGATIVE mg/dL
Ketones, ur: NEGATIVE mg/dL
Leukocytes, UA: NEGATIVE
pH: 7 (ref 5.0–8.0)

## 2011-03-09 MED ORDER — CYCLOBENZAPRINE HCL 10 MG PO TABS: 5.0000 mg | ORAL_TABLET | Freq: Two times a day (BID) | ORAL | Status: AC | PRN

## 2011-03-09 NOTE — ED Provider Notes (Signed)
History     CSN: [id number] Arrival date & time: 03/09/2011  2:23 PM   Chief Complaint  Patient presents with  . Tailbone Pain     (Include location/radiation/quality/duration/timing/severity/associated sxs/prior treatment) HPI Comments: Pt denies any injury:pt denies weakness or numbness:pt denies trying anything for the discomfort  Patient is a 30 y.o. female presenting with back pain. The history is provided by the patient. No language interpreter was used.  Back Pain  This is a new problem. The current episode started more than 1 week ago. The problem occurs constantly. The problem has not changed since onset.The pain is associated with no known injury. The pain is present in the sacro-iliac joint. The quality of the pain is described as aching. The pain does not radiate. The pain is moderate. The symptoms are aggravated by bending. The pain is the same all the time. She has tried nothing for the symptoms.     Past Medical History  Diagnosis Date  . Bipolar 1 disorder   . Kidney stones   . Gastritis   . Hypothyroid     post ablation for graves  . Asthma      Past Surgical History  Procedure Date  . Partial pancriatectomy   . Exploratory laparotomy   . Spleenectomy   . Tubal ligation   . Spleenectomy     Family History  Problem Relation Age of Onset  . Hyperlipidemia Mother   . Hyperlipidemia Father     History  Substance Use Topics  . Smoking status: Current Everyday Smoker -- 1.0 packs/day for 7 years    Types: Cigarettes  . Smokeless tobacco: Never Used  . Alcohol Use: No    OB History    Grav Para Term Preterm Abortions TAB SAB Ect Mult Living   4 1 1  0 3 0 3 0 0 0     Obstetric Comments   Pt does not have custody of her child due to her mental illness.      Review of Systems  Musculoskeletal: Positive for back pain.  All other systems reviewed and are negative.    Allergies  Barium sulfate; Ivp dye; Morphine and related; and  Lamictal  Home Medications   Current Outpatient Rx  Name Route Sig Dispense Refill  . ARIPIPRAZOLE 15 MG PO TABS Oral Take 15 mg by mouth daily.      Marland Kitchen LISDEXAMFETAMINE DIMESYLATE 20 MG PO CAPS Oral Take 20 mg by mouth every morning.      Marland Kitchen LORATADINE 10 MG PO TABS Oral Take 10 mg by mouth daily.      . ATORVASTATIN CALCIUM 40 MG PO TABS Oral Take 1 tablet (40 mg total) by mouth daily. 90 tablet 1  . AZITHROMYCIN 250 MG PO TABS  Take 2 tablets (500 mg) on  Day 1,  followed by 1 tablet (250 mg) once daily on Days 2 through 5. 6 each 0  . CETIRIZINE HCL 10 MG PO TABS Oral Take 1 tablet (10 mg total) by mouth at bedtime. 90 tablet 1  . ESOMEPRAZOLE MAGNESIUM 40 MG PO CPDR Oral Take 1 capsule (40 mg total) by mouth daily. 90 capsule 0  . GABAPENTIN 100 MG PO CAPS Oral Take 1 capsule (100 mg total) by mouth daily. 90 capsule 1  . HYDROXYZINE PAMOATE 50 MG PO CAPS Oral Take 50 mg by mouth at bedtime and may repeat dose one time if needed.      Marland Kitchen LEVOTHYROXINE SODIUM 175 MCG PO TABS  Oral Take 1 tablet (175 mcg total) by mouth daily. 90 tablet 1  . VERAPAMIL HCL CR 120 MG PO TBCR Oral Take 1 tablet (120 mg total) by mouth daily. 90 tablet 3    Physical Exam    BP 114/75  Pulse 65  Temp(Src) 98.2 F (36.8 C) (Oral)  Resp 18  Ht 5\' 1"  (1.549 m)  Wt 173 lb (78.472 kg)  BMI 32.69 kg/m2  SpO2 100%  Physical Exam  Nursing note and vitals reviewed. Constitutional: She appears well-developed and well-nourished.  HENT:  Head: Normocephalic and atraumatic.  Cardiovascular: Regular rhythm.   Pulmonary/Chest: Effort normal and breath sounds normal.  Abdominal: Soft.  Musculoskeletal: Normal range of motion. She exhibits tenderness.       Pt has sacroiliac tenderness  Neurological: She is alert.  Skin: Skin is warm and dry.    ED Course  Procedures  Results for orders placed during the hospital encounter of 03/09/11  URINALYSIS, ROUTINE W REFLEX MICROSCOPIC      Component Value Range    Color, Urine YELLOW  YELLOW    Appearance CLEAR  CLEAR    Specific Gravity, Urine 1.021  1.005 - 1.030    pH 7.0  5.0 - 8.0    Glucose, UA NEGATIVE  NEGATIVE (mg/dL)   Hgb urine dipstick NEGATIVE  NEGATIVE    Bilirubin Urine NEGATIVE  NEGATIVE    Ketones, ur NEGATIVE  NEGATIVE (mg/dL)   Protein, ur NEGATIVE  NEGATIVE (mg/dL)   Urobilinogen, UA 0.2  0.0 - 1.0 (mg/dL)   Nitrite NEGATIVE  NEGATIVE    Leukocytes, UA NEGATIVE  NEGATIVE   PREGNANCY, URINE      Component Value Range   Preg Test, Ur NEGATIVE     Dg Sacrum/coccyx  03/09/2011  *RADIOLOGY REPORT*  Clinical Data: Sacral pain.  Coccygeal pain.  SACRUM AND COCCYX - 2+ VIEW  Comparison: None.  Findings: Fallopian tube with fluid air are present.  Dropped clip is present in the anatomic pelvis.  Sacral arcades appear normal. No sacral fracture.  The SI joints appear within normal limits. Pubic symphysis normal.  IMPRESSION: Negative sacrum and coccyx radiographs.  Original Report Authenticated By: Andreas Newport, M.D.     No diagnosis found.   MDM No acute bony abnormality:will treat symptomatically       Teressa Lower, NP 03/09/11 1603

## 2011-03-09 NOTE — ED Notes (Signed)
Pt describes tailbone pain X 1 week. No known injury.

## 2011-03-11 NOTE — ED Provider Notes (Signed)
History/physical exam/procedure(s) were performed by non-physician practitioner and as supervising physician I was immediately available for consultation/collaboration. I have reviewed all notes and am in agreement with care and plan.   Priscilla Finklea S Wilsie Kern, MD 03/11/11 1142 

## 2011-03-12 LAB — CBC
HCT: 38.8
HCT: 39.2
HCT: 39.3
Hemoglobin: 13.6
MCHC: 34.6
MCHC: 35
MCV: 92.5
MCV: 93.2
MCV: 93.4
Platelets: 284
Platelets: 296
Platelets: 306
RBC: 4.2
RDW: 13.4
RDW: 13.9
WBC: 8.6

## 2011-03-12 LAB — DIFFERENTIAL
Basophils Absolute: 0
Basophils Absolute: 0
Eosinophils Absolute: 0.1
Eosinophils Absolute: 0.2
Eosinophils Relative: 1
Lymphocytes Relative: 34
Lymphs Abs: 2.8
Monocytes Absolute: 0.8
Monocytes Relative: 8
Neutro Abs: 5
Neutrophils Relative %: 54
Neutrophils Relative %: 58

## 2011-03-12 LAB — BASIC METABOLIC PANEL
BUN: 4 — ABNORMAL LOW
BUN: 7
CO2: 28
CO2: 28
Chloride: 104
Chloride: 109
GFR calc non Af Amer: >60
Glucose, Bld: 104 — ABNORMAL HIGH
Glucose, Bld: 83
Potassium: 3.6
Potassium: 4.3
Sodium: 143

## 2011-03-12 LAB — URINALYSIS, ROUTINE W REFLEX MICROSCOPIC
Glucose, UA: NEGATIVE
Glucose, UA: NEGATIVE
Glucose, UA: NEGATIVE
Hgb urine dipstick: NEGATIVE
Hgb urine dipstick: NEGATIVE
Ketones, ur: NEGATIVE
Ketones, ur: NEGATIVE
Nitrite: NEGATIVE
Protein, ur: NEGATIVE
Protein, ur: NEGATIVE
Protein, ur: NEGATIVE
Urobilinogen, UA: 0.2
Urobilinogen, UA: 1

## 2011-03-12 LAB — ACETAMINOPHEN LEVEL: Acetaminophen (Tylenol), Serum: <10 — ABNORMAL LOW

## 2011-03-12 LAB — BLOOD GAS, ARTERIAL
Acid-base deficit: 0.4
Drawn by: 295031
O2 Saturation: 94.2
TCO2: 21.1
pO2, Arterial: 69.5 — ABNORMAL LOW

## 2011-03-12 LAB — COMPREHENSIVE METABOLIC PANEL
BUN: 10
CO2: 29
Chloride: 105
Creatinine, Ser: 0.8
GFR calc non Af Amer: >60
Total Bilirubin: 1.3 — ABNORMAL HIGH

## 2011-03-12 LAB — PREGNANCY, URINE: Preg Test, Ur: NEGATIVE

## 2011-03-12 LAB — RAPID URINE DRUG SCREEN, HOSP PERFORMED
Amphetamines: NOT DETECTED
Benzodiazepines: POSITIVE — AB
Opiates: NOT DETECTED
Tetrahydrocannabinol: NOT DETECTED

## 2011-03-12 LAB — URINE MICROSCOPIC-ADD ON

## 2011-03-12 LAB — POCT CARDIAC MARKERS
CKMB, poc: <1 — ABNORMAL LOW
Myoglobin, poc: 61.4

## 2011-03-12 LAB — D-DIMER, QUANTITATIVE
D-Dimer, Quant: 0.23
D-Dimer, Quant: 0.23

## 2011-03-13 LAB — URINE DRUGS OF ABUSE SCREEN W ALC, ROUTINE (REF LAB)
Amphetamine Screen, Ur: NEGATIVE
Creatinine,U: 191.9
Marijuana Metabolite: NEGATIVE
Opiate Screen, Urine: NEGATIVE
Propoxyphene: NEGATIVE

## 2011-03-13 LAB — URINALYSIS, ROUTINE W REFLEX MICROSCOPIC
Hgb urine dipstick: NEGATIVE
Nitrite: NEGATIVE
Specific Gravity, Urine: 1.029
Urobilinogen, UA: 0.2

## 2011-03-13 LAB — URINE MICROSCOPIC-ADD ON

## 2011-03-13 LAB — BASIC METABOLIC PANEL
BUN: 11
CO2: 27
Calcium: 9.3
Creatinine, Ser: 0.85
GFR calc non Af Amer: >60
Glucose, Bld: 104 — ABNORMAL HIGH
Sodium: 137

## 2011-03-13 LAB — TSH: TSH: 75.684 — ABNORMAL HIGH

## 2011-03-14 LAB — URINALYSIS, ROUTINE W REFLEX MICROSCOPIC
Glucose, UA: NEGATIVE
Hgb urine dipstick: NEGATIVE
Specific Gravity, Urine: 1.026
pH: 6

## 2011-03-14 LAB — BASIC METABOLIC PANEL
CO2: 26
GFR calc Af Amer: >60
Glucose, Bld: 86
Potassium: 3.8
Sodium: 140

## 2011-03-17 LAB — CBC
HCT: 38.8
Platelets: 247
RDW: 13.1
WBC: 11.4 — ABNORMAL HIGH

## 2011-03-17 LAB — URINALYSIS, ROUTINE W REFLEX MICROSCOPIC
Glucose, UA: NEGATIVE
Ketones, ur: NEGATIVE
Nitrite: NEGATIVE
Protein, ur: NEGATIVE
pH: 7

## 2011-03-17 LAB — WET PREP, GENITAL
Trich, Wet Prep: NONE SEEN
Yeast Wet Prep HPF POC: NONE SEEN

## 2011-03-17 LAB — POCT PREGNANCY, URINE: Operator id: 220991

## 2011-03-17 LAB — GC/CHLAMYDIA PROBE AMP, GENITAL: Chlamydia, DNA Probe: NEGATIVE

## 2011-03-18 LAB — URINALYSIS, ROUTINE W REFLEX MICROSCOPIC
Glucose, UA: NEGATIVE
Hgb urine dipstick: NEGATIVE
Ketones, ur: 15 — AB
Protein, ur: NEGATIVE
pH: 5

## 2011-03-18 LAB — URINE MICROSCOPIC-ADD ON

## 2011-03-18 LAB — POCT PREGNANCY, URINE
Operator id: 13440
Preg Test, Ur: NEGATIVE

## 2011-04-01 LAB — RAPID STREP SCREEN (MED CTR MEBANE ONLY): Streptococcus, Group A Screen (Direct): NEGATIVE

## 2011-04-04 LAB — POCT CARDIAC MARKERS
CKMB, poc: <1 — ABNORMAL LOW
Myoglobin, poc: 27.9

## 2011-04-04 LAB — URINALYSIS, ROUTINE W REFLEX MICROSCOPIC
Bilirubin Urine: NEGATIVE
Ketones, ur: NEGATIVE
Leukocytes, UA: NEGATIVE
Nitrite: POSITIVE — AB
Protein, ur: NEGATIVE
Urobilinogen, UA: 0.2
pH: 6

## 2011-04-04 LAB — DIFFERENTIAL
Basophils Relative: 0
Eosinophils Relative: 0
Lymphocytes Relative: 30
Monocytes Absolute: 1.1 — ABNORMAL HIGH
Monocytes Relative: 7
Neutro Abs: 9 — ABNORMAL HIGH

## 2011-04-04 LAB — POCT PREGNANCY, URINE
Operator id: 29907
Preg Test, Ur: NEGATIVE

## 2011-04-04 LAB — CBC
HCT: 39.8
Platelets: 298
RDW: 13.6
WBC: 14.4 — ABNORMAL HIGH

## 2011-04-04 LAB — COMPREHENSIVE METABOLIC PANEL
AST: 14
Albumin: 3.6
Alkaline Phosphatase: 61
BUN: 6
Chloride: 109
GFR calc Af Amer: >60
Potassium: 3.5
Total Bilirubin: 0.9
Total Protein: 7

## 2011-04-04 LAB — URINE MICROSCOPIC-ADD ON

## 2011-04-07 ENCOUNTER — Ambulatory Visit (INDEPENDENT_AMBULATORY_CARE_PROVIDER_SITE_OTHER): Payer: Self-pay | Admitting: Family Medicine

## 2011-04-07 ENCOUNTER — Telehealth: Payer: Self-pay | Admitting: Family Medicine

## 2011-04-07 ENCOUNTER — Ambulatory Visit (HOSPITAL_COMMUNITY)
Admission: RE | Admit: 2011-04-07 | Discharge: 2011-04-07 | Disposition: A | Discharge status: Home / Self Care | Payer: Self-pay | Source: Ambulatory Visit | Attending: Family Medicine | Admitting: Family Medicine

## 2011-04-07 ENCOUNTER — Encounter: Payer: Self-pay | Admitting: Family Medicine

## 2011-04-07 VITALS — BP 118/84 | HR 80 | Temp 98.2°F | Ht 61.0 in | Wt 180.9 lb

## 2011-04-07 DIAGNOSIS — R05 Cough: Secondary | ICD-10-CM | POA: Insufficient documentation

## 2011-04-07 DIAGNOSIS — R059 Cough, unspecified: Secondary | ICD-10-CM | POA: Insufficient documentation

## 2011-04-07 DIAGNOSIS — R0602 Shortness of breath: Secondary | ICD-10-CM | POA: Insufficient documentation

## 2011-04-07 DIAGNOSIS — Z23 Encounter for immunization: Secondary | ICD-10-CM

## 2011-04-07 MED ORDER — AEROCHAMBER MV MISC: Status: AC

## 2011-04-07 MED ORDER — ALBUTEROL 90 MCG/ACT IN AERS: 2.0000 | INHALATION_SPRAY | RESPIRATORY_TRACT | Status: DC | PRN

## 2011-04-07 NOTE — Patient Instructions (Signed)
I will refill your albuterol with spacer Consider scheduling with Dr. Raymondo Band for smoking cessation counseling Will call you with Chest xray results this afternoon.

## 2011-04-07 NOTE — Telephone Encounter (Signed)
Ms. Christy Obrien [facility] to get [facility], but they don't carry them.  She was told to call here to see if [facility] carried them.

## 2011-04-07 NOTE — Progress Notes (Signed)
  Subjective:    Patient ID: Shirlyn Goltz, female    DOB: May 04, 1981, 30 y.o.   MRN: [id number]  HPI  30 year old female with history of asthma seen for 3 days history of cough.  Cough with sputum, mild sore throat.  She is concerned because states she has a history of frequent bronchitis and pneumonia and states she does not have .   No significant dyspnea.    No recent asthma exacerbations or hospitalizations.  Has history of asplenia.   Review of SystemsGeneral:  Negative for fever, chills, malaise, myalgias HEENT: Negative for conjunctivitis, ear pain or drainage, rhinorrhea, nasal congestion Respiratory:  Negative for cough, sputum, dyspnea Abdomen: Negative for abdominal pain, emesis, diarrhea Skin:  Negative for rash         Objective:   Physical Exam GEN: Alert & Oriented, No acute distress HEENT: [place]/AT. EOMI, PERRLA, no conjunctival injection or scleral icterus.  Bilateral tympanic membranes intact without erythema or effusion.  .  Nares without edema or rhinorrhea.  Oropharynx is without erythema or exudates.  No anterior or posterior cervical lymphadenopathy. CV:  Regular Rate & Rhythm, no murmur Respiratory:  Normal work of breathing, wheezes throughout.  No consolidation. Abd:  + BS, soft, no tenderness to palpation Ext: no pre-tibial edema        Assessment & Plan:

## 2011-04-07 NOTE — Assessment & Plan Note (Signed)
Chest xray normal- reassured patient that this likely represents viral URI.  UTP on pneumococcal vaccination, got flu shot today.  Spoke to her about smoking cessation - she is interested in speaking with Dr Raymondo Band about multiple difficulties with nicotine replacement and finding a solution that can work for her.

## 2011-04-07 NOTE — Telephone Encounter (Signed)
Dr. Earnest Bailey addressed Christy Obrien, Christy Obrien

## 2011-04-28 ENCOUNTER — Emergency Department (INDEPENDENT_AMBULATORY_CARE_PROVIDER_SITE_OTHER): Payer: Self-pay

## 2011-04-28 ENCOUNTER — Emergency Department (HOSPITAL_BASED_OUTPATIENT_CLINIC_OR_DEPARTMENT_OTHER)
Admission: EM | Admit: 2011-04-28 | Discharge: 2011-04-28 | Disposition: A | Discharge status: Home / Self Care | Payer: Self-pay | Attending: Emergency Medicine | Admitting: Emergency Medicine

## 2011-04-28 ENCOUNTER — Encounter (HOSPITAL_BASED_OUTPATIENT_CLINIC_OR_DEPARTMENT_OTHER): Payer: Self-pay | Admitting: *Deleted

## 2011-04-28 DIAGNOSIS — J3489 Other specified disorders of nose and nasal sinuses: Secondary | ICD-10-CM | POA: Insufficient documentation

## 2011-04-28 DIAGNOSIS — R05 Cough: Secondary | ICD-10-CM

## 2011-04-28 DIAGNOSIS — H9209 Otalgia, unspecified ear: Secondary | ICD-10-CM | POA: Insufficient documentation

## 2011-04-28 DIAGNOSIS — R0989 Other specified symptoms and signs involving the circulatory and respiratory systems: Secondary | ICD-10-CM

## 2011-04-28 DIAGNOSIS — R0602 Shortness of breath: Secondary | ICD-10-CM | POA: Insufficient documentation

## 2011-04-28 DIAGNOSIS — J111 Influenza due to unidentified influenza virus with other respiratory manifestations: Secondary | ICD-10-CM | POA: Insufficient documentation

## 2011-04-28 NOTE — ED Notes (Signed)
Pt amb to room 4 with quick steady gait in nad. Pt masked at registration area, remains on droplet precautions. Moist cough noted.

## 2011-04-28 NOTE — ED Provider Notes (Signed)
History     CSN: [id number] Arrival date & time: 04/28/2011  9:15 AM   First MD Initiated Contact with Patient 04/28/11 [id number]      Chief Complaint  Patient presents with  . Cough  . Nasal Congestion  . Otalgia    (Consider location/radiation/quality/duration/timing/severity/associated sxs/prior treatment) Patient is a 30 y.o. female presenting with cough and ear pain. The history is provided by the patient.  Cough This is a new problem. The current episode started more than 2 days ago. The problem occurs constantly. The problem has been gradually improving. The cough is non-productive. The maximum temperature recorded prior to her arrival was 100 to 100.9 F. The fever has been present for 3 to 4 days. Associated symptoms include chills, ear pain, rhinorrhea, myalgias, shortness of breath and wheezing. Pertinent negatives include no headaches, no sore throat and no eye redness. She has tried nothing for the symptoms. She is a smoker. Her past medical history is significant for bronchitis, pneumonia and asthma.  Otalgia Associated symptoms include rhinorrhea and cough. Pertinent negatives include no headaches and no sore throat.    Past Medical History  Diagnosis Date  . Bipolar 1 disorder   . Kidney stones   . Gastritis   . Hypothyroid     post ablation for graves  . Asthma     Past Surgical History  Procedure Date  . Partial pancriatectomy   . Exploratory laparotomy   . Spleenectomy   . Tubal ligation   . Spleenectomy     Family History  Problem Relation Age of Onset  . Hyperlipidemia Mother   . Hyperlipidemia Father     History  Substance Use Topics  . Smoking status: Current Everyday Smoker -- 1.0 packs/day for 7 years    Types: Cigarettes  . Smokeless tobacco: Never Used  . Alcohol Use: No    OB History    Grav Para Term Preterm Abortions TAB SAB Ect Mult Living   4 1 1  0 3 0 3 0 0 0     Obstetric Comments   Pt does not have custody of her child due to  her mental illness.      Review of Systems  Constitutional: Positive for chills.  HENT: Positive for ear pain, congestion and rhinorrhea. Negative for sore throat, trouble swallowing, voice change and sinus pressure.   Eyes: Negative for redness.  Respiratory: Positive for cough, shortness of breath and wheezing.   Musculoskeletal: Positive for myalgias.  Neurological: Negative for headaches.  All other systems reviewed and are negative.    Allergies  Barium sulfate; Ivp dye; Morphine and related; and Lamictal  Home Medications   Current Outpatient Rx  Name Route Sig Dispense Refill  . ARIPIPRAZOLE 15 MG PO TABS Oral Take 15 mg by mouth daily.      Marland Kitchen HYDROXYZINE PAMOATE 50 MG PO CAPS Oral Take 50 mg by mouth at bedtime and may repeat dose one time if needed.      Marland Kitchen LISDEXAMFETAMINE DIMESYLATE 20 MG PO CAPS Oral Take 20 mg by mouth every morning.      Marland Kitchen LORATADINE 10 MG PO TABS Oral Take 10 mg by mouth daily.      . ALBUTEROL 90 MCG/ACT IN AERS Inhalation Inhale 2 puffs into the lungs every 4 (four) hours as needed for wheezing. 17 g 2  . ATORVASTATIN CALCIUM 40 MG PO TABS Oral Take 1 tablet (40 mg total) by mouth daily. 90 tablet 1  . ESOMEPRAZOLE MAGNESIUM  40 MG PO CPDR Oral Take 1 capsule (40 mg total) by mouth daily. 90 capsule 0  . GABAPENTIN 100 MG PO CAPS Oral Take 1 capsule (100 mg total) by mouth daily. 90 capsule 1  . LEVOTHYROXINE SODIUM 175 MCG PO TABS Oral Take 1 tablet (175 mcg total) by mouth daily. 90 tablet 1  . AEROCHAMBER MV MISC  Use as instructed with albuterol inhaler 1 each 0  . VERAPAMIL HCL ER 120 MG PO TBCR Oral Take 1 tablet (120 mg total) by mouth daily. 90 tablet 3    BP 130/84  Pulse 93  Temp(Src) 98.3 F (36.8 C) (Oral)  Resp 16  Ht 5\' 1"  (1.549 m)  Wt 178 lb (80.74 kg)  BMI 33.63 kg/m2  SpO2 100%  Physical Exam  Nursing note and vitals reviewed. Constitutional: She is oriented to person, place, and time. She appears well-developed and  well-nourished. No distress.  HENT:  Head: Normocephalic and atraumatic.  Right Ear: External ear normal.  Left Ear: External ear normal.  Nose: Mucosal edema and rhinorrhea present. No sinus tenderness.  Mouth/Throat: Oropharynx is clear and moist. No oropharyngeal exudate or posterior oropharyngeal erythema.  Eyes: EOM are normal. Pupils are equal, round, and reactive to light.  Neck: Normal range of motion. Neck supple.  Cardiovascular: Normal rate, regular rhythm, normal heart sounds and intact distal pulses.  Exam reveals no friction rub.   No murmur heard. Pulmonary/Chest: Effort normal and breath sounds normal. She has no rales.       Very mild expiratory wheeze in the LLL  Abdominal: Soft. Bowel sounds are normal. She exhibits no distension. There is no tenderness. There is no rebound and no guarding.  Musculoskeletal: Normal range of motion. She exhibits no tenderness.       No edema  Lymphadenopathy:    She has no cervical adenopathy.  Neurological: She is alert and oriented to person, place, and time. No cranial nerve deficit.  Skin: Skin is warm and dry. No rash noted.  Psychiatric: She has a normal mood and affect. Her behavior is normal.    ED Course  Procedures (including critical care time)  Labs Reviewed - No data to display Dg Chest 2 View  04/28/2011  *RADIOLOGY REPORT*  Clinical Data: Cough, shortness of breath, congestion, smoking history  CHEST - 2 VIEW  Comparison: Chest x-ray of 04/07/2011  Findings: The lungs are clear.  There is mild peribronchial thickening which may indicate chronic bronchitis.  Mediastinal contours are normal.  The heart is within normal limits in size. No bony abnormality is seen.  Surgical clips are present in the right upper quadrant from prior cholecystectomy.  IMPRESSION: Probable chronic bronchitis.  No active lung disease.  Original Report Authenticated By: Juline Patch, M.D.     No diagnosis found.    MDM   Pt with symptoms  consistent with influenza.  Normal exam here.  No signs of breathing difficulty  No signs of strep pharyngitis, otitis or abnormal abdominal findings.   CXR wnl.  Will continue antipyretica and rest and fluids and return for any further problems.  Pt has a hx of asthma and is smoker and states inhaler helps some but still feeling tight and SOB.  CXR wnl.  Pt offered prednisone to help with asthma but states she does not do well on prednisone and wants to use inhaler and f/u with PCP.      Gwyneth Sprout, MD 04/28/11 1013

## 2011-06-05 ENCOUNTER — Emergency Department (INDEPENDENT_AMBULATORY_CARE_PROVIDER_SITE_OTHER)
Admission: EM | Admit: 2011-06-05 | Discharge: 2011-06-05 | Disposition: A | Discharge status: Home / Self Care | Payer: Self-pay | Source: Home / Self Care | Attending: Emergency Medicine | Admitting: Emergency Medicine

## 2011-06-05 ENCOUNTER — Encounter (HOSPITAL_COMMUNITY): Payer: Self-pay | Admitting: *Deleted

## 2011-06-05 DIAGNOSIS — J069 Acute upper respiratory infection, unspecified: Secondary | ICD-10-CM

## 2011-06-05 HISTORY — DX: Attention-deficit hyperactivity disorder, unspecified type: F90.9

## 2011-06-05 HISTORY — DX: Pure hypercholesterolemia, unspecified: E78.00

## 2011-06-05 LAB — POCT RAPID STREP A: Streptococcus, Group A Screen (Direct): NEGATIVE

## 2011-06-05 MED ORDER — HYDROCODONE-ACETAMINOPHEN 5-325 MG PO TABS: ORAL_TABLET | ORAL | Status: AC

## 2011-06-05 MED ORDER — BENZONATATE 200 MG PO CAPS: 200.0000 mg | ORAL_CAPSULE | Freq: Three times a day (TID) | ORAL | Status: AC | PRN

## 2011-06-05 NOTE — ED Provider Notes (Signed)
History     CSN: [id number] Arrival date & time: 06/05/2011  5:59 PM   First MD Initiated Contact with Patient 06/05/11 1728      Chief Complaint  Patient presents with  . Sore Throat    (Consider location/radiation/quality/duration/timing/severity/associated sxs/prior treatment) HPI Comments: Christy Obrien has had a history since last night of sore throat, earache, hoarseness, has felt feverish, chilled, has had sweats, aching, headache, a loose rattly cough, and some nasal congestion. She has been exposed to strep through her son who is being treated right now.  Patient is a 30 y.o. female presenting with pharyngitis.  Sore Throat Pertinent negatives include no abdominal pain and no shortness of breath.    Past Medical History  Diagnosis Date  . Bipolar 1 disorder   . Gastritis   . Hypothyroid     post ablation for graves  . Asthma   . Kidney stones   . ADD (attention deficit disorder with hyperactivity)   . High cholesterol     Past Surgical History  Procedure Date  . Partial pancriatectomy   . Spleenectomy   . Tubal ligation   . Spleenectomy   . Exploratory laparotomy   . Cholecystectomy   . Ablation colpoclesis     Family History  Problem Relation Age of Onset  . Hyperlipidemia Mother   . Hyperlipidemia Father     History  Substance Use Topics  . Smoking status: Current Everyday Smoker -- 1.0 packs/day for 7 years    Types: Cigarettes  . Smokeless tobacco: Never Used  . Alcohol Use: Yes    OB History    Grav Para Term Preterm Abortions TAB SAB Ect Mult Living   4 1 1  0 3 0 3 0 0 0     Obstetric Comments   Pt does not have custody of her child due to her mental illness.      Review of Systems  Constitutional: Positive for fever, chills, diaphoresis and fatigue.  HENT: Positive for ear pain, congestion, sore throat, rhinorrhea and voice change. Negative for sneezing, neck stiffness and postnasal drip.   Eyes: Negative for pain, discharge and  redness.  Respiratory: Positive for cough. Negative for chest tightness, shortness of breath and wheezing.   Gastrointestinal: Negative for nausea, vomiting, abdominal pain and diarrhea.  Skin: Negative for rash.    Allergies  Barium sulfate; Ivp dye; Morphine and related; Penicillins; and Lamictal  Home Medications   Current Outpatient Rx  Name Route Sig Dispense Refill  . ALBUTEROL 90 MCG/ACT IN AERS Inhalation Inhale 2 puffs into the lungs every 4 (four) hours as needed for wheezing. 17 g 2  . ARIPIPRAZOLE 15 MG PO TABS Oral Take 15 mg by mouth daily.      . ATORVASTATIN CALCIUM 40 MG PO TABS Oral Take 1 tablet (40 mg total) by mouth daily. 90 tablet 1  . BENZONATATE 200 MG PO CAPS Oral Take 1 capsule (200 mg total) by mouth 3 (three) times daily as needed for cough. 30 capsule 0  . ESOMEPRAZOLE MAGNESIUM 40 MG PO CPDR Oral Take 1 capsule (40 mg total) by mouth daily. 90 capsule 0  . GABAPENTIN 100 MG PO CAPS Oral Take 1 capsule (100 mg total) by mouth daily. 90 capsule 1  . HYDROCODONE-ACETAMINOPHEN 5-325 MG PO TABS  1 to 2 tabs every 4 to 6 hours as needed for pain. 20 tablet 0  . HYDROXYZINE PAMOATE 50 MG PO CAPS Oral Take 50 mg by mouth at bedtime  and may repeat dose one time if needed.      Marland Kitchen LEVOTHYROXINE SODIUM 175 MCG PO TABS Oral Take 1 tablet (175 mcg total) by mouth daily. 90 tablet 1  . LISDEXAMFETAMINE DIMESYLATE 20 MG PO CAPS Oral Take 20 mg by mouth every morning.      Marland Kitchen LORATADINE 10 MG PO TABS Oral Take 10 mg by mouth daily.      Ival Bible MV MISC  Use as instructed with albuterol inhaler 1 each 0  . VERAPAMIL HCL ER 120 MG PO TBCR Oral Take 1 tablet (120 mg total) by mouth daily. 90 tablet 3    BP 104/74  Pulse 75  Temp(Src) 98.2 F (36.8 C) (Oral)  Resp 20  SpO2 97%  Physical Exam  Nursing note and vitals reviewed. Constitutional: She appears well-developed and well-nourished. No distress.  HENT:  Head: Normocephalic and atraumatic.  Right Ear:  External ear normal.  Left Ear: External ear normal.  Nose: Nose normal.  Mouth/Throat: Oropharynx is clear and moist. No oropharyngeal exudate.  Eyes: Conjunctivae and EOM are normal. Pupils are equal, round, and reactive to light. Right eye exhibits no discharge. Left eye exhibits no discharge.  Neck: Normal range of motion. Neck supple.  Cardiovascular: Normal rate, regular rhythm and normal heart sounds.   Pulmonary/Chest: Effort normal and breath sounds normal. No stridor. No respiratory distress. She has no wheezes. She has no rales. She exhibits no tenderness.  Lymphadenopathy:    She has no cervical adenopathy.  Skin: Skin is warm and dry. No rash noted. She is not diaphoretic.    ED Course  Procedures (including critical care time)  Results for orders placed during the hospital encounter of 06/05/11  POCT RAPID STREP A ([facility] ONLY)      Component Value Range   Streptococcus, Group A Screen (Direct) NEGATIVE  NEGATIVE       Labs Reviewed  POCT RAPID STREP A ([facility] ONLY)   No results found.   1. Viral upper respiratory tract infection       MDM  She appears to have a viral laryngo-tracheobronchitis. Will treat symptomatically with benzonatate and guaifenesin/codeine cough syrup.        Roque Lias, MD 06/05/11 [id number]

## 2011-06-05 NOTE — ED Notes (Signed)
C/o severe sore throat, "stopped up, sore itchy ears since last night" and states child has strep throat

## 2011-11-21 ENCOUNTER — Emergency Department (HOSPITAL_BASED_OUTPATIENT_CLINIC_OR_DEPARTMENT_OTHER)
Admission: EM | Admit: 2011-11-21 | Discharge: 2011-11-21 | Disposition: A | Discharge status: Home / Self Care | Payer: Self-pay | Attending: Emergency Medicine | Admitting: Emergency Medicine

## 2011-11-21 ENCOUNTER — Emergency Department (HOSPITAL_BASED_OUTPATIENT_CLINIC_OR_DEPARTMENT_OTHER): Payer: Self-pay

## 2011-11-21 ENCOUNTER — Encounter (HOSPITAL_BASED_OUTPATIENT_CLINIC_OR_DEPARTMENT_OTHER): Payer: Self-pay | Admitting: *Deleted

## 2011-11-21 DIAGNOSIS — F988 Other specified behavioral and emotional disorders with onset usually occurring in childhood and adolescence: Secondary | ICD-10-CM | POA: Insufficient documentation

## 2011-11-21 DIAGNOSIS — Z79899 Other long term (current) drug therapy: Secondary | ICD-10-CM | POA: Insufficient documentation

## 2011-11-21 DIAGNOSIS — S46919A Strain of unspecified muscle, fascia and tendon at shoulder and upper arm level, unspecified arm, initial encounter: Secondary | ICD-10-CM

## 2011-11-21 DIAGNOSIS — J45909 Unspecified asthma, uncomplicated: Secondary | ICD-10-CM | POA: Insufficient documentation

## 2011-11-21 DIAGNOSIS — W219XXA Striking against or struck by unspecified sports equipment, initial encounter: Secondary | ICD-10-CM | POA: Insufficient documentation

## 2011-11-21 DIAGNOSIS — IMO0002 Reserved for concepts with insufficient information to code with codable children: Secondary | ICD-10-CM | POA: Insufficient documentation

## 2011-11-21 DIAGNOSIS — Y92838 Other recreation area as the place of occurrence of the external cause: Secondary | ICD-10-CM | POA: Insufficient documentation

## 2011-11-21 DIAGNOSIS — F17200 Nicotine dependence, unspecified, uncomplicated: Secondary | ICD-10-CM | POA: Insufficient documentation

## 2011-11-21 DIAGNOSIS — Y9375 Activity, martial arts: Secondary | ICD-10-CM | POA: Insufficient documentation

## 2011-11-21 DIAGNOSIS — F319 Bipolar disorder, unspecified: Secondary | ICD-10-CM | POA: Insufficient documentation

## 2011-11-21 DIAGNOSIS — E7800 Pure hypercholesterolemia, unspecified: Secondary | ICD-10-CM | POA: Insufficient documentation

## 2011-11-21 DIAGNOSIS — E039 Hypothyroidism, unspecified: Secondary | ICD-10-CM | POA: Insufficient documentation

## 2011-11-21 DIAGNOSIS — Y9239 Other specified sports and athletic area as the place of occurrence of the external cause: Secondary | ICD-10-CM | POA: Insufficient documentation

## 2011-11-21 MED ORDER — OXYCODONE-ACETAMINOPHEN 5-325 MG PO TABS: 1.0000 | ORAL_TABLET | Freq: Four times a day (QID) | ORAL | Status: AC | PRN

## 2011-11-21 NOTE — ED Provider Notes (Signed)
History     CSN: [id number]  Arrival date & time 11/21/11  2119   First MD Initiated Contact with Patient 11/21/11 2152      Chief Complaint  Patient presents with  . Shoulder Pain    (Consider location/radiation/quality/duration/timing/severity/associated sxs/prior treatment) Patient is a 31 y.o. female presenting with shoulder pain. The history is provided by the patient.  Shoulder Pain This is a new problem. The current episode started less than 1 hour ago (was at martial arts class, punched a dummy and shoulder went out.  Pain since.). The problem occurs constantly. The problem has not changed since onset.Exacerbated by: movement, palpation. The symptoms are relieved by nothing. She has tried nothing for the symptoms.    Past Medical History  Diagnosis Date  . Bipolar 1 disorder   . Gastritis   . Hypothyroid     post ablation for graves  . Asthma   . Kidney stones   . ADD (attention deficit disorder with hyperactivity)   . High cholesterol     Past Surgical History  Procedure Date  . Partial pancriatectomy   . Spleenectomy   . Tubal ligation   . Spleenectomy   . Exploratory laparotomy   . Cholecystectomy   . Ablation colpoclesis     Family History  Problem Relation Age of Onset  . Hyperlipidemia Mother   . Hyperlipidemia Father     History  Substance Use Topics  . Smoking status: Current Everyday Smoker -- 1.0 packs/day for 7 years    Types: Cigarettes  . Smokeless tobacco: Never Used  . Alcohol Use: Yes    OB History    Grav Para Term Preterm Abortions TAB SAB Ect Mult Living   4 1 1  0 3 0 3 0 0 0     Obstetric Comments   Pt does not have custody of her child due to her mental illness.      Review of Systems  All other systems reviewed and are negative.    Allergies  Barium sulfate; Ivp dye; Morphine and related; Penicillins; and Lamictal  Home Medications   Current Outpatient Rx  Name Route Sig Dispense Refill  . ARIPIPRAZOLE 15 MG  PO TABS Oral Take 15 mg by mouth daily.      . ATORVASTATIN CALCIUM 40 MG PO TABS Oral Take 1 tablet (40 mg total) by mouth daily. 90 tablet 1  . ESOMEPRAZOLE MAGNESIUM 40 MG PO CPDR Oral Take 1 capsule (40 mg total) by mouth daily. 90 capsule 0  . GABAPENTIN 100 MG PO CAPS Oral Take 1 capsule (100 mg total) by mouth daily. 90 capsule 1  . HYDROXYZINE PAMOATE 50 MG PO CAPS Oral Take 50 mg by mouth at bedtime and may repeat dose one time if needed.      Marland Kitchen LEVOTHYROXINE SODIUM 175 MCG PO TABS Oral Take 1 tablet (175 mcg total) by mouth daily. 90 tablet 1  . LISDEXAMFETAMINE DIMESYLATE 20 MG PO CAPS Oral Take 20 mg by mouth every morning.      Marland Kitchen LORATADINE 10 MG PO TABS Oral Take 10 mg by mouth daily.      Ival Bible MV MISC  Use as instructed with albuterol inhaler 1 each 0  . ALBUTEROL 90 MCG/ACT IN AERS Inhalation Inhale 2 puffs into the lungs every 4 (four) hours as needed for wheezing. 17 g 2  . ARIPIPRAZOLE 15 MG PO TABS Oral Take 1 tablet (15 mg total) by mouth daily. 90 tablet 1  BP 118/80  Pulse 76  Temp(Src) 98 F (36.7 C) (Oral)  Resp 18  Ht 5' (1.524 m)  Wt 180 lb (81.647 kg)  BMI 35.15 kg/m2  SpO2 100%  Physical Exam  Nursing note and vitals reviewed. Constitutional: She is oriented to person, place, and time. She appears well-developed and well-nourished. No distress.  HENT:  Head: Normocephalic and atraumatic.  Neck: Normal range of motion. Neck supple.  Musculoskeletal:       The right shoulder appears grossly normal.  There is no obvious deformity or swelling.  There is pain with any range of motion.  The radial and ulnar pulses are palpable distally.  Neurologically intact distally.  Neurological: She is alert and oriented to person, place, and time.  Skin: Skin is warm and dry. She is not diaphoretic.    ED Course  Procedures (including critical care time)  Labs Reviewed - No data to display No results found.   No diagnosis found.    MDM  The films  do not reveal a fracture or dislocation.  Will discharge with sling and pain meds.  To follow up prn if not improving in the next week.        Geoffery Lyons, MD 11/21/11 [phone]

## 2011-11-21 NOTE — Discharge Instructions (Signed)
Joint Sprain A sprain is a tear or stretch in the ligaments that hold a joint together. Severe sprains may need as long as 3-6 weeks of immobilization and/or exercises to heal completely. Sprained joints should be rested and protected. If not, they can become unstable and prone to re-injury. Proper treatment can reduce your pain, shorten the period of disability, and reduce the risk of repeated injuries. TREATMENT   Rest and elevate the injured joint to reduce pain and swelling.   Apply ice packs to the injury for 20-30 minutes every 2-3 hours for the next 2-3 days.   Keep the injury wrapped in a compression bandage or splint as long as the joint is painful or as instructed by your caregiver.   Do not use the injured joint until it is completely healed to prevent re-injury and chronic instability. Follow the instructions of your caregiver.   Long-term sprain management may require exercises and/or treatment by a physical therapist. Taping or special braces may help stabilize the joint until it is completely better.  SEEK MEDICAL CARE IF:   You develop increased pain or swelling of the joint.   You develop increasing redness and warmth of the joint.   You develop a fever.   It becomes stiff.   Your hand or foot gets cold or numb.  Document Released: 07/17/2004 Document Revised: 05/29/2011 Document Reviewed: 06/26/2008 [employer] Patient Information 2012 [employer] 

## 2011-11-21 NOTE — ED Notes (Signed)
Pt reports being in a self defense class. Threw a defensive punch and shoulder "popped" on right shoulder. Pt states right arm is swollen and hurts to move.

## 2011-11-21 NOTE — ED Notes (Signed)
Pt reports being in a Self Defense class. Was executing a punch with her right arm and felt a "pop" sensation. States right arm is sore and swollen. Pt has limited movement of right arm. Good radial pulse, and circulation to the right arm.

## 2012-03-04 ENCOUNTER — Encounter (HOSPITAL_BASED_OUTPATIENT_CLINIC_OR_DEPARTMENT_OTHER): Payer: Self-pay | Admitting: *Deleted

## 2012-03-04 ENCOUNTER — Emergency Department (HOSPITAL_BASED_OUTPATIENT_CLINIC_OR_DEPARTMENT_OTHER)
Admission: EM | Admit: 2012-03-04 | Discharge: 2012-03-04 | Disposition: A | Discharge status: Home / Self Care | Payer: Self-pay | Attending: Emergency Medicine | Admitting: Emergency Medicine

## 2012-03-04 ENCOUNTER — Emergency Department (HOSPITAL_BASED_OUTPATIENT_CLINIC_OR_DEPARTMENT_OTHER): Payer: Self-pay

## 2012-03-04 DIAGNOSIS — E039 Hypothyroidism, unspecified: Secondary | ICD-10-CM | POA: Insufficient documentation

## 2012-03-04 DIAGNOSIS — F319 Bipolar disorder, unspecified: Secondary | ICD-10-CM | POA: Insufficient documentation

## 2012-03-04 DIAGNOSIS — Z79899 Other long term (current) drug therapy: Secondary | ICD-10-CM | POA: Insufficient documentation

## 2012-03-04 DIAGNOSIS — E7800 Pure hypercholesterolemia, unspecified: Secondary | ICD-10-CM | POA: Insufficient documentation

## 2012-03-04 DIAGNOSIS — F988 Other specified behavioral and emotional disorders with onset usually occurring in childhood and adolescence: Secondary | ICD-10-CM | POA: Insufficient documentation

## 2012-03-04 DIAGNOSIS — F17200 Nicotine dependence, unspecified, uncomplicated: Secondary | ICD-10-CM | POA: Insufficient documentation

## 2012-03-04 DIAGNOSIS — J45909 Unspecified asthma, uncomplicated: Secondary | ICD-10-CM | POA: Insufficient documentation

## 2012-03-04 DIAGNOSIS — R110 Nausea: Secondary | ICD-10-CM | POA: Insufficient documentation

## 2012-03-04 DIAGNOSIS — R1012 Left upper quadrant pain: Secondary | ICD-10-CM | POA: Insufficient documentation

## 2012-03-04 DIAGNOSIS — Z9089 Acquired absence of other organs: Secondary | ICD-10-CM | POA: Insufficient documentation

## 2012-03-04 DIAGNOSIS — R109 Unspecified abdominal pain: Secondary | ICD-10-CM

## 2012-03-04 LAB — CBC
HCT: 37.1 % (ref 36.0–46.0)
Hemoglobin: 12.8 g/dL (ref 12.0–15.0)
MCH: 32.4 pg (ref 26.0–34.0)
MCHC: 34.5 g/dL (ref 30.0–36.0)
MCV: 93.9 fL (ref 78.0–100.0)
RBC: 3.95 MIL/uL (ref 3.87–5.11)

## 2012-03-04 LAB — COMPREHENSIVE METABOLIC PANEL
ALT: 7 U/L (ref 0–35)
BUN: 11 mg/dL (ref 6–23)
CO2: 26 mEq/L (ref 19–32)
Calcium: 9.2 mg/dL (ref 8.4–10.5)
Creatinine, Ser: 0.8 mg/dL (ref 0.50–1.10)
GFR calc Af Amer: >90 mL/min (ref >90)
GFR calc non Af Amer: >90 mL/min (ref >90)
Glucose, Bld: 95 mg/dL (ref 70–99)

## 2012-03-04 LAB — URINALYSIS, ROUTINE W REFLEX MICROSCOPIC
Ketones, ur: NEGATIVE mg/dL
Leukocytes, UA: NEGATIVE
Nitrite: NEGATIVE
Protein, ur: NEGATIVE mg/dL

## 2012-03-04 LAB — LIPASE, BLOOD: Lipase: 15 U/L (ref 11–59)

## 2012-03-04 LAB — PREGNANCY, URINE: Preg Test, Ur: NEGATIVE

## 2012-03-04 MED ORDER — HYDROMORPHONE HCL PF 1 MG/ML IJ SOLN
1.0000 mg | Freq: Once | INTRAMUSCULAR | Status: AC
  Administered 2012-03-04: 1 mg via INTRAVENOUS
  Filled 2012-03-04: qty 1

## 2012-03-04 MED ORDER — ONDANSETRON HCL 4 MG PO TABS: 4.0000 mg | ORAL_TABLET | Freq: Four times a day (QID) | ORAL | Status: AC

## 2012-03-04 MED ORDER — ONDANSETRON HCL 4 MG/2ML IJ SOLN
4.0000 mg | Freq: Once | INTRAMUSCULAR | Status: AC
  Administered 2012-03-04: 4 mg via INTRAVENOUS
  Filled 2012-03-04: qty 2

## 2012-03-04 MED ORDER — SODIUM CHLORIDE 0.9 % IV BOLUS (SEPSIS)
1000.0000 mL | Freq: Once | INTRAVENOUS | Status: AC
  Administered 2012-03-04: 1000 mL via INTRAVENOUS

## 2012-03-04 NOTE — ED Notes (Signed)
Pt reports abd pain x Tuesday off and on, with watery stools x 4 yesterday. Nausea but no emesis. Denies fevers.

## 2012-03-04 NOTE — ED Provider Notes (Signed)
History     CSN: [id number]  Arrival date & time 03/04/12  7829   First MD Initiated Contact with Patient 03/04/12 0845      Chief Complaint  Patient presents with  . Abdominal Pain    (Consider location/radiation/quality/duration/timing/severity/associated sxs/prior treatment) HPI Pt presenting with c/o right upper abdominal and right flank pain.  Pain was acute in onset approx 2 days ago and has been waxing and waning since that time.  Last week had low grade fever.  None this week.  Has had nausea but no vomiting.  As well as watery diarrhea- no blood or mucous.  Has had decreased fluid intake over the past several days due to nausea.  Pt has had a hx of multiple surgeries including cholecystectomy, pancreatectomy and splenectomy.  There are no other associated systemic symptoms, there are no other alleviating or modifying factors.   Past Medical History  Diagnosis Date  . Bipolar 1 disorder   . Gastritis   . Hypothyroid     post ablation for graves  . Asthma   . Kidney stones   . ADD (attention deficit disorder with hyperactivity)   . High cholesterol     Past Surgical History  Procedure Date  . Partial pancriatectomy   . Spleenectomy   . Tubal ligation   . Spleenectomy   . Exploratory laparotomy   . Cholecystectomy   . Ablation colpoclesis     Family History  Problem Relation Age of Onset  . Hyperlipidemia Mother   . Hyperlipidemia Father     History  Substance Use Topics  . Smoking status: Current Every Day Smoker -- 1.0 packs/day for 7 years    Types: Cigarettes  . Smokeless tobacco: Never Used  . Alcohol Use: Yes    OB History    Grav Para Term Preterm Abortions TAB SAB Ect Mult Living   4 1 1  0 3 0 3 0 0 0     Obstetric Comments   Pt does not have custody of her child due to her mental illness.      Review of Systems ROS reviewed and all otherwise negative except for mentioned in HPI  Allergies  Barium sulfate; Ivp dye; Morphine and  related; Penicillins; and Lamictal  Home Medications   Current Outpatient Rx  Name Route Sig Dispense Refill  . ALBUTEROL 90 MCG/ACT IN AERS Inhalation Inhale 2 puffs into the lungs every 4 (four) hours as needed for wheezing. 17 g 2  . ARIPIPRAZOLE 15 MG PO TABS Oral Take 1 tablet (15 mg total) by mouth daily. 90 tablet 1  . ARIPIPRAZOLE 15 MG PO TABS Oral Take 15 mg by mouth daily.      . ATORVASTATIN CALCIUM 40 MG PO TABS Oral Take 1 tablet (40 mg total) by mouth daily. 90 tablet 1  . ESOMEPRAZOLE MAGNESIUM 40 MG PO CPDR Oral Take 1 capsule (40 mg total) by mouth daily. 90 capsule 0  . GABAPENTIN 100 MG PO CAPS Oral Take 1 capsule (100 mg total) by mouth daily. 90 capsule 1  . HYDROXYZINE PAMOATE 50 MG PO CAPS Oral Take 50 mg by mouth at bedtime and may repeat dose one time if needed.      Marland Kitchen LEVOTHYROXINE SODIUM 175 MCG PO TABS Oral Take 1 tablet (175 mcg total) by mouth daily. 90 tablet 1  . LISDEXAMFETAMINE DIMESYLATE 20 MG PO CAPS Oral Take 20 mg by mouth every morning.      Marland Kitchen LORATADINE 10 MG PO  TABS Oral Take 10 mg by mouth daily.      Marland Kitchen ONDANSETRON HCL 4 MG PO TABS Oral Take 1 tablet (4 mg total) by mouth every 6 (six) hours. 12 tablet 0  . AEROCHAMBER MV MISC  Use as instructed with albuterol inhaler 1 each 0    BP 115/71  Pulse 74  Temp 98 F (36.7 C) (Oral)  Resp 16  Ht 5' 0.25" (1.53 m)  Wt 180 lb (81.647 kg)  BMI 34.86 kg/m2  SpO2 99% Vitals reviewed Physical Exam Physical Examination: General appearance - alert, well appearing, and in no distress Mental status - alert, oriented to person, place, and time Eyes - pupils equal and reactive, no scleral icterus or conjunctival injection Mouth - mucous membranes moist, pharynx normal without lesions Neck - supple, no significant adenopathy Chest - clear to auscultation, no wheezes, rales or rhonchi, symmetric air entry Heart - normal rate, regular rhythm, normal S1, S2, no murmurs, rubs, clicks or gallops Abdomen -  soft, mild ttp in RUQ and LUQ, nondistended, no masses or organomegaly, no gaurding, no rebound tenderness, nabs Extremities - peripheral pulses normal, no pedal edema, no clubbing or cyanosis Skin - normal coloration and turgor, no rashes, brisk cap refill  ED Course  Procedures (including critical care time)  Labs Reviewed  URINALYSIS, ROUTINE W REFLEX MICROSCOPIC - Abnormal; Notable for the following:    APPearance CLOUDY (*)     Hgb urine dipstick TRACE (*)     All other components within normal limits  URINE MICROSCOPIC-ADD ON - Abnormal; Notable for the following:    Squamous Epithelial / LPF FEW (*)     All other components within normal limits  PREGNANCY, URINE  CBC  COMPREHENSIVE METABOLIC PANEL  LIPASE, BLOOD   Ct Abdomen Pelvis Wo Contrast  03/04/2012  *RADIOLOGY REPORT*  Clinical Data: Right side abdominal pain.  History of cholecystectomy and splenectomy.  CT ABDOMEN AND PELVIS WITHOUT CONTRAST  Technique:  Multidetector CT imaging of the abdomen and pelvis was performed following the standard protocol without intravenous contrast.  Comparison: CT abdomen 08/02/2017  Findings: Lung bases are clear.  No pericardial fluid.  Non-IV contrast images demonstrate no focal hepatic lesion.  Post cholecystectomy.  There are several clips within the abdominal mesentery of the upper abdomen.    Partial pancreatectomy.  Prior splenectomy. There is a peripherally calcified round 2 cm mass inferior to the greater curvature of the stomach (image 39).  No nephrolithiasis or ureterolithiasis.  No obstructive uropathy evident.  The stomach, small bowel, appendix, and cecum are normal.  The colon and rectosigmoid colon are normal.  Abdominal aorta is normal caliber.  No retroperitoneal periportal lymphadenopathy.  No free fluid in the abdomen.  No free fluid the pelvis.  Bladder is normal.  There are contraceptive devices within the fallopian tubes.   No adnexal abnormality.  No free fluid the pelvis.   No bladder stones distal ureteral stones.  IMPRESSION:  1.  No acute abdominal or pelvic findings. 2.  Post cholecystectomy, splenectomy, partial pancreatectomy. 3.  No nephrolithiasis or ureterolithiasis.  No obstructive uropathy. 4. Peripherally calcified mass adjacent to the greater curvature of the stomach may represent remnant of the spleen or a postsurgical granulation.  This appears benign.   Original Report Authenticated By: Genevive Bi, M.D.      1. Abdominal pain   2. Nausea       MDM  Pt with multiple prior surgeries presenting with abdominal pain and nausea.  Labs and CT scan are reassuring- no signs of obstruction or other significant pathology.  Pt tolerating fluids in ED, discharged with strict return precautions.  She is agreeable with this plan.         Ethelda Chick, MD 03/04/12 1137

## 2012-03-04 NOTE — ED Notes (Signed)
Pt given 4 oz. Ginger ale for fluid challenge. Pt awake and alert, denies any c/o.

## 2012-07-26 ENCOUNTER — Other Ambulatory Visit: Payer: Self-pay | Admitting: Family Medicine

## 2012-07-26 MED ORDER — LORATADINE 10 MG PO TABS: 10.0000 mg | ORAL_TABLET | Freq: Every day | ORAL | Status: DC

## 2012-08-25 ENCOUNTER — Other Ambulatory Visit: Payer: Self-pay | Admitting: Family Medicine

## 2012-08-25 MED ORDER — ATORVASTATIN CALCIUM 40 MG PO TABS: 40.0000 mg | ORAL_TABLET | Freq: Every day | ORAL | Status: DC

## 2012-09-06 ENCOUNTER — Encounter: Payer: Self-pay | Admitting: Obstetrics & Gynecology

## 2012-09-06 ENCOUNTER — Ambulatory Visit (INDEPENDENT_AMBULATORY_CARE_PROVIDER_SITE_OTHER): Payer: Medicaid Other | Admitting: Obstetrics & Gynecology

## 2012-09-06 VITALS — BP 131/80 | HR 89 | Temp 97.3°F | Wt 166.0 lb

## 2012-09-06 DIAGNOSIS — A630 Anogenital (venereal) warts: Secondary | ICD-10-CM

## 2012-09-06 DIAGNOSIS — N898 Other specified noninflammatory disorders of vagina: Secondary | ICD-10-CM | POA: Insufficient documentation

## 2012-09-06 NOTE — Progress Notes (Signed)
Patient ID: Christy Obrien, female   DOB: 19-Oct-1980, 32 y.o.   MRN: [id number]  Chief Complaint  Patient presents with  . Genital Warts    HPI Christy Obrien is a 32 y.o. female.  No LMP recorded. Patient has had an ablation.[id number] Pap was normal 07/27/12 at cancer screening. Cx was friable and genital warts were noted.    HPI  Past Medical History  Diagnosis Date  . Bipolar 1 disorder   . Gastritis   . Asthma   . ADD (attention deficit disorder with hyperactivity)   . High cholesterol   . Kidney stones   . Hypothyroid     post ablation for graves  . Anxiety     general anxiety disorder  . Migraines     Past Surgical History  Procedure Laterality Date  . Partial pancriatectomy    . Spleenectomy    . Tubal ligation    . Spleenectomy    . Exploratory laparotomy    . Cholecystectomy    . Ablation colpoclesis    . Dilitation & currettage/hystroscopy with essure      Family History  Problem Relation Age of Onset  . Hyperlipidemia Mother   . Hyperlipidemia Father   . Cancer Paternal Grandmother     breast    Social History History  Substance Use Topics  . Smoking status: Current Every Day Smoker -- 1.00 packs/day for 7 years    Types: Cigarettes  . Smokeless tobacco: Never Used  . Alcohol Use: No    Allergies  Allergen Reactions  . Barium Sulfate Nausea And Vomiting    Must be treated with zofran less than 5 mins before per patient  . Ivp Dye (Iodinated Diagnostic Agents) Nausea And Vomiting    Must be treated with zofran less than 5 mins before per patient    . Morphine And Related     :"Evil"  . Penicillins   . Lamictal (Lamotrigine) Rash    Current Outpatient Prescriptions  Medication Sig Dispense Refill  . ARIPiprazole (ABILIFY) 15 MG tablet Take 15 mg by mouth daily.        . cetirizine (ZYRTEC) 10 MG tablet Take 10 mg by mouth daily.      . hydrOXYzine (VISTARIL) 50 MG capsule Take 50 mg by mouth at bedtime and may repeat dose one time  if needed.        Marland Kitchen levothyroxine (SYNTHROID, LEVOTHROID) 175 MCG tablet Take 175 mcg by mouth daily.      Marland Kitchen lisdexamfetamine (VYVANSE) 20 MG capsule Take 20 mg by mouth every morning.        Marland Kitchen albuterol (PROVENTIL,VENTOLIN) 90 MCG/ACT inhaler Inhale 2 puffs into the lungs every 4 (four) hours as needed for wheezing.  17 g  2  . ARIPiprazole (ABILIFY) 15 MG tablet Take 1 tablet (15 mg total) by mouth daily.  90 tablet  1  . atorvastatin (LIPITOR) 40 MG tablet Take 1 tablet (40 mg total) by mouth daily.  30 tablet  0  . esomeprazole (NEXIUM) 40 MG capsule Take 1 capsule (40 mg total) by mouth daily.  90 capsule  0  . gabapentin (NEURONTIN) 100 MG capsule Take 1 capsule (100 mg total) by mouth daily.  90 capsule  1  . levothyroxine (SYNTHROID, LEVOTHROID) 175 MCG tablet Take 1 tablet (175 mcg total) by mouth daily.  90 tablet  1  . loratadine (CLARITIN) 10 MG tablet Take 1 tablet (10 mg total) by mouth daily.  30  tablet  0   No current facility-administered medications for this visit.    Review of Systems Review of Systems  Genitourinary: Positive for vaginal discharge (ocassional spotting) and genital sores (genital warts r labia, some itching). Negative for dysuria and menstrual problem.    Blood pressure 131/80, pulse 89, temperature 97.3 F (36.3 C), temperature source Oral, weight 166 lb (75.297 kg).  Physical Exam Physical Exam  Nursing note and vitals reviewed. Constitutional: She appears well-developed and well-nourished. No distress.  Abdominal: She exhibits no distension. There is no tenderness.  Genitourinary: Uterus normal. Vaginal discharge (white, thin. Cervix normal. STD probe and wet prep) found.  Neurological: She is alert.  Skin: Skin is warm and dry.  Psychiatric: She has a normal mood and affect. Her behavior is normal.   4-5 smal genital warts right labium majus, time out and consent, treated with TCA Data Reviewed Pap result normal  Assessment    Genital  warts STD testing     Plan    4 week f/u F/u lab results        ARNOLD,JAMES 09/06/2012, 3:14 PM

## 2012-09-06 NOTE — Patient Instructions (Signed)
Genital Warts Genital warts are a sexually transmitted infection. They may appear as small bumps on the tissues of the genital area. CAUSES  Genital warts are caused by a virus called human papillomavirus (HPV). HPV is the most common sexually transmitted disease (STD) and infection of the sex organs. This infection is spread by having unprotected sex with an infected person. It can be spread by vaginal, anal, and oral sex. Many people do not know they are infected. They may be infected for years without problems. However, even if they do not have problems, they can unknowingly pass the infection to their sexual partners. SYMPTOMS   Itching and irritation in the genital area.  Warts that bleed.  Painful sexual intercourse. DIAGNOSIS  Warts are usually recognized with the naked eye on the vagina, vulva, perineum, anus, and rectum. Certain tests can also diagnose genital warts, such as:  A Pap test.  A tissue sample (biopsy) exam.  Colposcopy. A magnifying tool is used to examine the vagina and cervix. The HPV cells will change color when certain solutions are used. TREATMENT  Warts can be removed by:  Applying certain chemicals, such as cantharidin or podophyllin.  Liquid nitrogen freezing (cryotherapy).  Immunotherapy with candida or trichophyton injections.  Laser treatment.  Burning with an electrified probe (electrocautery).  Interferon injections.  Surgery. PREVENTION  HPV vaccination can help prevent HPV infections that cause genital warts and that cause cancer of the cervix. It is recommended that the vaccination be given to people between the ages 9 to 26 years old. The vaccine might not work as well or might not work at all if you already have HPV. It should not be given to pregnant women. HOME CARE INSTRUCTIONS   It is important to follow your caregiver's instructions. The warts will not go away without treatment. Repeat treatments are often needed to get rid of warts.  Even after it appears that the warts are gone, the normal tissue underneath often remains infected.  Do not try to treat genital warts with medicine used to treat hand warts. This type of medicine is strong and can burn the skin in the genital area, causing more damage.  Tell your past and current sexual partner(s) that you have genital warts. They may be infected also and need treatment.  Avoid sexual contact while being treated.  Do not touch or scratch the warts. The infection may spread to other parts of your body.  Women with genital warts should have a cervical cancer check (Pap test) at least once a year. This type of cancer is slow-growing and can be cured if found early. Chances of developing cervical cancer are increased with HPV.  Inform your obstetrician about your warts in the event of pregnancy. This virus can be passed to the baby's respiratory tract. Discuss this with your caregiver.  Use a condom during sexual intercourse. Following treatment, the use of condoms will help prevent reinfection.  Ask your caregiver about using over-the-counter anti-itch creams. SEEK MEDICAL CARE IF:   Your treated skin becomes red, swollen, or painful.  You have a fever.  You feel generally ill.  You feel little lumps in and around your genital area.  You are bleeding or have painful sexual intercourse. MAKE SURE YOU:   Understand these instructions.  Will watch your condition.  Will get help right away if you are not doing well or get worse. Document Released: 06/06/2000 Document Revised: 09/01/2011 Document Reviewed: 12/16/2010 [employer] Patient Information 2013 [employer].  

## 2012-09-07 LAB — GC/CHLAMYDIA PROBE AMP
CT Probe RNA: NEGATIVE
GC Probe RNA: NEGATIVE

## 2012-09-08 ENCOUNTER — Other Ambulatory Visit: Payer: Self-pay | Admitting: Obstetrics & Gynecology

## 2012-09-08 DIAGNOSIS — N760 Acute vaginitis: Secondary | ICD-10-CM

## 2012-09-08 DIAGNOSIS — B9689 Other specified bacterial agents as the cause of diseases classified elsewhere: Secondary | ICD-10-CM

## 2012-09-08 MED ORDER — METRONIDAZOLE 500 MG PO TABS: 500.0000 mg | ORAL_TABLET | Freq: Two times a day (BID) | ORAL | Status: AC

## 2012-09-08 NOTE — Progress Notes (Signed)
Patient called requesting results. Asked that we leave detailed message if she didn't answer. Per her request results left on her phone and informed her to pick up meds at pharmacy.

## 2012-09-15 ENCOUNTER — Encounter: Payer: Self-pay | Admitting: *Deleted

## 2012-09-16 ENCOUNTER — Encounter: Payer: Self-pay | Admitting: Obstetrics & Gynecology

## 2012-10-04 ENCOUNTER — Encounter: Payer: Self-pay | Admitting: Obstetrics & Gynecology

## 2012-10-04 ENCOUNTER — Ambulatory Visit (INDEPENDENT_AMBULATORY_CARE_PROVIDER_SITE_OTHER): Payer: Medicaid Other | Admitting: Obstetrics & Gynecology

## 2012-10-04 VITALS — BP 126/86 | HR 94 | Temp 97.5°F | Ht 60.75 in | Wt 168.0 lb

## 2012-10-04 DIAGNOSIS — A630 Anogenital (venereal) warts: Secondary | ICD-10-CM

## 2012-10-04 NOTE — Patient Instructions (Signed)
Genital Warts Genital warts are a sexually transmitted infection. They may appear as small bumps on the tissues of the genital area. CAUSES  Genital warts are caused by a virus called human papillomavirus (HPV). HPV is the most common sexually transmitted disease (STD) and infection of the sex organs. This infection is spread by having unprotected sex with an infected person. It can be spread by vaginal, anal, and oral sex. Many people do not know they are infected. They may be infected for years without problems. However, even if they do not have problems, they can unknowingly pass the infection to their sexual partners. SYMPTOMS   Itching and irritation in the genital area.  Warts that bleed.  Painful sexual intercourse. DIAGNOSIS  Warts are usually recognized with the naked eye on the vagina, vulva, perineum, anus, and rectum. Certain tests can also diagnose genital warts, such as:  A Pap test.  A tissue sample (biopsy) exam.  Colposcopy. A magnifying tool is used to examine the vagina and cervix. The HPV cells will change color when certain solutions are used. TREATMENT  Warts can be removed by:  Applying certain chemicals, such as cantharidin or podophyllin.  Liquid nitrogen freezing (cryotherapy).  Immunotherapy with candida or trichophyton injections.  Laser treatment.  Burning with an electrified probe (electrocautery).  Interferon injections.  Surgery. PREVENTION  HPV vaccination can help prevent HPV infections that cause genital warts and that cause cancer of the cervix. It is recommended that the vaccination be given to people between the ages 9 to 26 years old. The vaccine might not work as well or might not work at all if you already have HPV. It should not be given to pregnant women. HOME CARE INSTRUCTIONS   It is important to follow your caregiver's instructions. The warts will not go away without treatment. Repeat treatments are often needed to get rid of warts.  Even after it appears that the warts are gone, the normal tissue underneath often remains infected.  Do not try to treat genital warts with medicine used to treat hand warts. This type of medicine is strong and can burn the skin in the genital area, causing more damage.  Tell your past and current sexual partner(s) that you have genital warts. They may be infected also and need treatment.  Avoid sexual contact while being treated.  Do not touch or scratch the warts. The infection may spread to other parts of your body.  Women with genital warts should have a cervical cancer check (Pap test) at least once a year. This type of cancer is slow-growing and can be cured if found early. Chances of developing cervical cancer are increased with HPV.  Inform your obstetrician about your warts in the event of pregnancy. This virus can be passed to the baby's respiratory tract. Discuss this with your caregiver.  Use a condom during sexual intercourse. Following treatment, the use of condoms will help prevent reinfection.  Ask your caregiver about using over-the-counter anti-itch creams. SEEK MEDICAL CARE IF:   Your treated skin becomes red, swollen, or painful.  You have a fever.  You feel generally ill.  You feel little lumps in and around your genital area.  You are bleeding or have painful sexual intercourse. MAKE SURE YOU:   Understand these instructions.  Will watch your condition.  Will get help right away if you are not doing well or get worse. Document Released: 06/06/2000 Document Revised: 09/01/2011 Document Reviewed: 12/16/2010 [employer] Patient Information 2013 [employer].  

## 2012-10-04 NOTE — Progress Notes (Signed)
Subjective:     Patient ID: Christy Obrien, female   DOB: 1981-03-20, 32 y.o.   MRN: [id number]  HPI No LMP recorded. Patient has had an ablation. [id number] Patient returns to f/u after TCA tx of genital warts. She agrees to retreatment.   Review of Systems Improvement of sx of genital warts    Objective:   Physical Exam Filed Vitals:   10/04/12 1254  BP: 126/86  Pulse: 94  Temp: 97.5 F (36.4 C)  NAD, pleasant 5 small genital warts right labium majus, time out and consent, treated with TCA       Assessment:     TCA tx of genital warts    Plan:     RTC 4 weeks  Adam Phenix, MD 10/04/2012 1:23 PM

## 2012-11-04 ENCOUNTER — Ambulatory Visit: Payer: Medicaid Other | Admitting: Obstetrics & Gynecology

## 2012-11-25 ENCOUNTER — Ambulatory Visit: Payer: Medicaid Other | Admitting: Obstetrics & Gynecology

## 2012-12-05 ENCOUNTER — Emergency Department (HOSPITAL_BASED_OUTPATIENT_CLINIC_OR_DEPARTMENT_OTHER)
Admission: EM | Admit: 2012-12-05 | Discharge: 2012-12-05 | Disposition: A | Discharge status: Home / Self Care | Payer: Medicaid Other | Attending: Emergency Medicine | Admitting: Emergency Medicine

## 2012-12-05 ENCOUNTER — Encounter (HOSPITAL_BASED_OUTPATIENT_CLINIC_OR_DEPARTMENT_OTHER): Payer: Self-pay

## 2012-12-05 ENCOUNTER — Emergency Department (HOSPITAL_BASED_OUTPATIENT_CLINIC_OR_DEPARTMENT_OTHER): Payer: Medicaid Other

## 2012-12-05 DIAGNOSIS — Z79899 Other long term (current) drug therapy: Secondary | ICD-10-CM | POA: Insufficient documentation

## 2012-12-05 DIAGNOSIS — W219XXA Striking against or struck by unspecified sports equipment, initial encounter: Secondary | ICD-10-CM | POA: Insufficient documentation

## 2012-12-05 DIAGNOSIS — S99921A Unspecified injury of right foot, initial encounter: Secondary | ICD-10-CM

## 2012-12-05 DIAGNOSIS — F17200 Nicotine dependence, unspecified, uncomplicated: Secondary | ICD-10-CM | POA: Insufficient documentation

## 2012-12-05 DIAGNOSIS — W1809XA Striking against other object with subsequent fall, initial encounter: Secondary | ICD-10-CM | POA: Insufficient documentation

## 2012-12-05 DIAGNOSIS — Z8719 Personal history of other diseases of the digestive system: Secondary | ICD-10-CM | POA: Insufficient documentation

## 2012-12-05 DIAGNOSIS — Z87442 Personal history of urinary calculi: Secondary | ICD-10-CM | POA: Insufficient documentation

## 2012-12-05 DIAGNOSIS — F411 Generalized anxiety disorder: Secondary | ICD-10-CM | POA: Insufficient documentation

## 2012-12-05 DIAGNOSIS — S99929A Unspecified injury of unspecified foot, initial encounter: Secondary | ICD-10-CM | POA: Insufficient documentation

## 2012-12-05 DIAGNOSIS — Y9389 Activity, other specified: Secondary | ICD-10-CM | POA: Insufficient documentation

## 2012-12-05 DIAGNOSIS — F319 Bipolar disorder, unspecified: Secondary | ICD-10-CM | POA: Insufficient documentation

## 2012-12-05 DIAGNOSIS — S8990XA Unspecified injury of unspecified lower leg, initial encounter: Secondary | ICD-10-CM | POA: Insufficient documentation

## 2012-12-05 DIAGNOSIS — E039 Hypothyroidism, unspecified: Secondary | ICD-10-CM | POA: Insufficient documentation

## 2012-12-05 DIAGNOSIS — Z8679 Personal history of other diseases of the circulatory system: Secondary | ICD-10-CM | POA: Insufficient documentation

## 2012-12-05 DIAGNOSIS — Z8639 Personal history of other endocrine, nutritional and metabolic disease: Secondary | ICD-10-CM | POA: Insufficient documentation

## 2012-12-05 DIAGNOSIS — Z880 Allergy status to penicillin: Secondary | ICD-10-CM | POA: Insufficient documentation

## 2012-12-05 DIAGNOSIS — F909 Attention-deficit hyperactivity disorder, unspecified type: Secondary | ICD-10-CM | POA: Insufficient documentation

## 2012-12-05 DIAGNOSIS — J45909 Unspecified asthma, uncomplicated: Secondary | ICD-10-CM | POA: Insufficient documentation

## 2012-12-05 DIAGNOSIS — Y9289 Other specified places as the place of occurrence of the external cause: Secondary | ICD-10-CM | POA: Insufficient documentation

## 2012-12-05 DIAGNOSIS — Z862 Personal history of diseases of the blood and blood-forming organs and certain disorders involving the immune mechanism: Secondary | ICD-10-CM | POA: Insufficient documentation

## 2012-12-05 MED ORDER — NAPROXEN 500 MG PO TABS: 500.0000 mg | ORAL_TABLET | Freq: Two times a day (BID) | ORAL | Status: DC

## 2012-12-05 NOTE — ED Provider Notes (Signed)
History     CSN: [id number]  Arrival date & time 12/05/12  2004   First MD Initiated Contact with Patient 12/05/12 2200      Chief Complaint  Patient presents with  . Foot Injury    (Consider location/radiation/quality/duration/timing/severity/associated sxs/prior treatment) HPI Comments: Patient is a 32 year old female who presents with right foot pain that started 2 nights ago after she hit her foot on a fireplace grate when walking through her garage in the dark. The pain started suddenly and remained constant since the onset. The pain is throbbing and severe without radiation. Patient reports associated abrasion to the affected area. Patient did not take anything for pain. She has been wearing a post-op shoe because regular shoes are too tight on her right foot. Walking makes the pain worse. Nothing makes the pain better. Patient denies any other injury.   Patient is a 32 y.o. female presenting with foot injury.  Foot Injury   Past Medical History  Diagnosis Date  . Bipolar 1 disorder   . Gastritis   . Asthma   . ADD (attention deficit disorder with hyperactivity)   . High cholesterol   . Kidney stones   . Hypothyroid     post ablation for graves  . Anxiety     general anxiety disorder  . Migraines     Past Surgical History  Procedure Laterality Date  . Partial pancriatectomy    . Spleenectomy    . Tubal ligation    . Spleenectomy    . Exploratory laparotomy    . Cholecystectomy    . Ablation colpoclesis    . Dilitation & currettage/hystroscopy with essure      Family History  Problem Relation Age of Onset  . Hyperlipidemia Mother   . Hyperlipidemia Father   . Cancer Paternal Grandmother     breast    History  Substance Use Topics  . Smoking status: Current Every Day Smoker -- 1.00 packs/day for 7 years    Types: Cigarettes  . Smokeless tobacco: Never Used  . Alcohol Use: No    OB History   Grav Para Term Preterm Abortions TAB SAB Ect Mult Living    4 1 1  0 3 0 3 0 0 1     Obstetric Comments   Pt does not have custody of her child due to her mental illness.      Review of Systems  Musculoskeletal: Positive for arthralgias.  All other systems reviewed and are negative.    Allergies  Barium sulfate; Ivp dye; Morphine and related; Penicillins; and Lamictal  Home Medications   Current Outpatient Rx  Name  Route  Sig  Dispense  Refill  . hydrOXYzine (VISTARIL) 50 MG capsule   Oral   Take 50 mg by mouth at bedtime and may repeat dose one time if needed.           Marland Kitchen EXPIRED: albuterol (PROVENTIL,VENTOLIN) 90 MCG/ACT inhaler   Inhalation   Inhale 2 puffs into the lungs every 4 (four) hours as needed for wheezing.   17 g   2   . EXPIRED: ARIPiprazole (ABILIFY) 15 MG tablet   Oral   Take 1 tablet (15 mg total) by mouth daily.   90 tablet   1   . ARIPiprazole (ABILIFY) 15 MG tablet   Oral   Take 10 mg by mouth continuous.          . cetirizine (ZYRTEC) 10 MG tablet   Oral  Take 10 mg by mouth daily.         Marland Kitchen EXPIRED: levothyroxine (SYNTHROID, LEVOTHROID) 175 MCG tablet   Oral   Take 1 tablet (175 mcg total) by mouth daily.   90 tablet   1   . levothyroxine (SYNTHROID, LEVOTHROID) 175 MCG tablet   Oral   Take 175 mcg by mouth daily.         Marland Kitchen lisdexamfetamine (VYVANSE) 20 MG capsule   Oral   Take 20 mg by mouth every morning.           . loratadine (CLARITIN) 10 MG tablet   Oral   Take 1 tablet (10 mg total) by mouth daily.   30 tablet   0     BP 124/75  Pulse 89  Temp(Src) 98.3 F (36.8 C) (Oral)  Resp 18  SpO2 100%  Physical Exam  Nursing note and vitals reviewed. Constitutional: She appears well-developed and well-nourished. No distress.  HENT:  Head: Normocephalic and atraumatic.  Eyes: Conjunctivae are normal.  Neck: Normal range of motion.  Cardiovascular: Normal rate and regular rhythm.  Exam reveals no gallop and no friction rub.   No murmur heard. Pulmonary/Chest:  Effort normal and breath sounds normal. She has no wheezes. She has no rales. She exhibits no tenderness.  Abdominal: Soft. There is no tenderness.  Musculoskeletal: Normal range of motion.  Right dorsal foot tender to palpation.   Neurological: She is alert.  Speech is goal-oriented. Moves limbs without ataxia.   Skin: Skin is warm and dry.  Small abrasion noted to dorsal aspect of right foot with mild surrounding bruising.   Psychiatric: She has a normal mood and affect. Her behavior is normal.    ED Course  Procedures (including critical care time)  Labs Reviewed - No data to display Dg Foot Complete Right  12/05/2012   *RADIOLOGY REPORT*  Clinical Data: History of injury with pain in the anterior aspect of the right foot.  RIGHT FOOT COMPLETE - 3+ VIEW  Comparison: No priors.  Findings: Three views of the right foot demonstrate no definite acute displaced fracture, subluxation, dislocation, joint or soft tissue abnormality.  IMPRESSION: 1.  No acute radiographic abnormality of the right foot.   Original Report Authenticated By: Trudie Reed, M.D.     1. Right foot injury, initial encounter       MDM  10:32 PM Xray is negative for acute changes. Patient will have Naproxen for pain. Patient instructed to ice injury as needed. No neurovascular compromise.         Emilia Beck, [place] 12/07/12 [phone]

## 2012-12-05 NOTE — ED Notes (Signed)
Patient reports that she fell last pm over fireplace grate and complain of right foot pain, pain with ambulation. Also complains of left wrist pain from possible carpal tunnel, no trauma to wrist

## 2012-12-07 NOTE — ED Provider Notes (Signed)
Medical screening examination/treatment/procedure(s) were performed by non-physician practitioner and as supervising physician I was immediately available for consultation/collaboration.   Kecia Swoboda B. Ladye Macnaughton, MD 12/07/12 0928 

## 2013-08-24 ENCOUNTER — Telehealth: Payer: Self-pay | Admitting: Hematology & Oncology

## 2013-08-24 NOTE — Telephone Encounter (Signed)
Spoke w NEW PATIENT today to remind them of their appointment with Dr. Ennever. Also, advised them to bring all meds and insurance information. ° °

## 2013-08-26 ENCOUNTER — Encounter: Payer: Self-pay | Admitting: Hematology & Oncology

## 2013-08-26 ENCOUNTER — Other Ambulatory Visit (HOSPITAL_BASED_OUTPATIENT_CLINIC_OR_DEPARTMENT_OTHER): Payer: Medicaid Other | Admitting: Lab

## 2013-08-26 ENCOUNTER — Ambulatory Visit (HOSPITAL_BASED_OUTPATIENT_CLINIC_OR_DEPARTMENT_OTHER): Payer: Medicaid Other | Admitting: Hematology & Oncology

## 2013-08-26 ENCOUNTER — Ambulatory Visit: Payer: Medicaid Other

## 2013-08-26 VITALS — BP 116/68 | HR 93 | Temp 98.2°F | Resp 14 | Ht 61.0 in | Wt 182.0 lb

## 2013-08-26 DIAGNOSIS — D473 Essential (hemorrhagic) thrombocythemia: Secondary | ICD-10-CM

## 2013-08-26 DIAGNOSIS — D45 Polycythemia vera: Secondary | ICD-10-CM

## 2013-08-26 DIAGNOSIS — F17200 Nicotine dependence, unspecified, uncomplicated: Secondary | ICD-10-CM

## 2013-08-26 DIAGNOSIS — E039 Hypothyroidism, unspecified: Secondary | ICD-10-CM

## 2013-08-26 DIAGNOSIS — D72829 Elevated white blood cell count, unspecified: Secondary | ICD-10-CM

## 2013-08-26 LAB — CBC WITH DIFFERENTIAL (CANCER CENTER ONLY)
BASO#: 0.1 10*3/uL (ref 0.0–0.2)
BASO%: 0.4 % (ref 0.0–2.0)
EOS%: 3.9 % (ref 0.0–7.0)
Eosinophils Absolute: 0.5 10*3/uL (ref 0.0–0.5)
HCT: 42.9 % (ref 34.8–46.6)
HGB: 14.5 g/dL (ref 11.6–15.9)
LYMPH#: 5.3 10*3/uL — AB (ref 0.9–3.3)
LYMPH%: 39.1 % (ref 14.0–48.0)
MCH: 31.6 pg (ref 26.0–34.0)
MCHC: 33.8 g/dL (ref 32.0–36.0)
MCV: 94 fL (ref 81–101)
MONO#: 1.1 10*3/uL — AB (ref 0.1–0.9)
MONO%: 8.3 % (ref 0.0–13.0)
NEUT%: 48.3 % (ref 39.6–80.0)
NEUTROS ABS: 6.5 10*3/uL (ref 1.5–6.5)
PLATELETS: 525 10*3/uL — AB (ref 145–400)
RBC: 4.59 10*6/uL (ref 3.70–5.32)
RDW: 13.3 % (ref 11.1–15.7)
WBC: 13.4 10*3/uL — AB (ref 3.9–10.0)

## 2013-08-26 LAB — CHCC SATELLITE - SMEAR

## 2013-08-26 LAB — TECHNOLOGIST REVIEW CHCC SATELLITE

## 2013-08-26 NOTE — Patient Instructions (Signed)
You Can Quit Smoking If you are ready to quit smoking or are thinking about it, congratulations! You have chosen to help yourself be healthier and live longer! There are lots of different ways to quit smoking. Nicotine gum, nicotine patches, a nicotine inhaler, or nicotine nasal spray can help with physical craving. Hypnosis, support groups, and medicines help break the habit of smoking. TIPS TO GET OFF AND STAY OFF CIGARETTES  Learn to predict your moods. Do not let a bad situation be your excuse to have a cigarette. Some situations in your life might tempt you to have a cigarette.  Ask friends and co-workers not to smoke around you.  Make your home smoke-free.  Never have "just one" cigarette. It leads to wanting another and another. Remind yourself of your decision to quit.  On a card, make a list of your reasons for not smoking. Read it at least the same number of times a day as you have a cigarette. Tell yourself everyday, "I do not want to smoke. I choose not to smoke."  Ask someone at home or work to help you with your plan to quit smoking.  Have something planned after you eat or have a cup of coffee. Take a walk or get other exercise to perk you up. This will help to keep you from overeating.  Try a relaxation exercise to calm you down and decrease your stress. Remember, you may be tense and nervous the first two weeks after you quit. This will pass.  Find new activities to keep your hands busy. Play with a pen, coin, or rubber band. Doodle or draw things on paper.  Brush your teeth right after eating. This will help cut down the craving for the taste of tobacco after meals. You can try mouthwash too.  Try gum, breath mints, or diet candy to keep something in your mouth. IF YOU SMOKE AND WANT TO QUIT:  Do not stock up on cigarettes. Never buy a carton. Wait until one pack is finished before you buy another.  Never carry cigarettes with you at work or at home.  Keep cigarettes  as far away from you as possible. Leave them with someone else.  Never carry matches or a lighter with you.  Ask yourself, "Do I need this cigarette or is this just a reflex?"  Bet with someone that you can quit. Put cigarette money in a piggy bank every morning. If you smoke, you give up the money. If you do not smoke, by the end of the week, you keep the money.  Keep trying. It takes 21 days to change a habit!  Talk to your doctor about using medicines to help you quit. These include nicotine replacement gum, lozenges, or skin patches. Document Released: 04/05/2009 Document Revised: 09/01/2011 Document Reviewed: 04/05/2009 [employer] Patient Information 2014 [employer].  

## 2013-08-26 NOTE — Progress Notes (Signed)
Referral MD  Reason for Referral: Leukocytosis and Thrombocytosis  Chief Complaint  Patient presents with  . NEW PATIENT  : My blood counts are abnormal  HPI: 33yo white female.  Has high WBC and platelets.  Is asymptomatic. No rash.  No fever.  She feels that she may have a cold right now.  No cough.  Still smoking.  Occasional headache. No weight loss.  No recent change in meds. 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 Gallstone). We were asked to see her for evaluation of the above issue.    Past Medical History  Diagnosis Date  . Bipolar 1 disorder   . Gastritis   . Asthma   . ADD (attention deficit disorder with hyperactivity)   . High cholesterol   . Kidney stones   . Hypothyroid     post ablation for graves  . Anxiety     general anxiety disorder  . Migraines   :  Past Surgical History  Procedure Laterality Date  . Partial pancriatectomy    . Spleenectomy    . Tubal ligation    . Spleenectomy    . Exploratory laparotomy    . Cholecystectomy    . Ablation colpoclesis    . Dilitation & currettage/hystroscopy with essure    :  Current outpatient prescriptions:albuterol (PROVENTIL,VENTOLIN) 90 MCG/ACT inhaler, Inhale 2 puffs into the lungs every 4 (four) hours as needed for wheezing., Disp: 17 g, Rfl: 2;  amphetamine-dextroamphetamine (ADDERALL XR) 20 MG 24 hr capsule, Take 20 mg by mouth 2 (two) times daily., Disp: , Rfl: ;  ARIPiprazole (ABILIFY MAINTENA) 400 MG SUSR, Inject into the muscle every 30 (thirty) days., Disp: , Rfl:  hydrOXYzine (VISTARIL) 50 MG capsule, Take 25 mg by mouth every 6 (six) hours as needed. , Disp: , Rfl: ;  levothyroxine (SYNTHROID) 150 MCG tablet, Take 150 mcg by mouth daily before breakfast., Disp: , Rfl: :  :  Allergies  Allergen Reactions  . Barium Sulfate Nausea And Vomiting    Must be treated with zofran less than 5 mins before per patient  . Ivp Dye [Iodinated Diagnostic Agents] Nausea And  Vomiting    Must be treated with zofran less than 5 mins before per patient    . Morphine And Related     :"Evil"  . Penicillins   . Lamictal [Lamotrigine] Rash  :  Family History  Problem Relation Age of Onset  . Hyperlipidemia Mother   . Hyperlipidemia Father   . Cancer Paternal Grandmother     breast  :  History   Social History  . Marital Status: Legally Separated    Spouse Name: N/A    Number of Children: N/A  . Years of Education: N/A   Occupational History  . Not on file.   Social History Main Topics  . Smoking status: Current Every Day Smoker -- 1.00 packs/day for 10 years    Types: Cigarettes    Start date: 08/26/2001  . Smokeless tobacco: Never Used     Comment: still smoking  . Alcohol Use: No  . Drug Use: No  . Sexual Activity: Not Currently    Birth Control/ Protection: None   Other Topics Concern  . Not on file   Social History Narrative   Pt is not working has traveled around a lot in the last 3 years   Now living with a boyfriend sexually active uses protection.   :  Pertinent items are noted  in HPI.  Exam: @[facility]ALS @ [facility] female. No adenopathy. No hepatomegaly. Oral exam is (-).  (-) ocular lesions.  No rashes.  Lungs are clear. Cardiac exam is RRR. (-) murmurs. (-) abdominal mass.  (-) ascites.  Joints are normal. (-) rashes.  (-) CNS findings.   Recent Labs  08/26/13 1334  WBC 13.4*  HGB 14.5  HCT 42.9  PLT 525*   No results found for this basename: NA, K, CL, CO2, GLUCOSE, BUN, CREATININE, CALCIUM,  in the last 72 hours  Blood smear review: [place] red cells.  Occasional [employer] bodies. No schistocytes. No rouleaux.  No nucleated RBC. Increased WBC - mature.  A few hypersegmented.  Rare myelocyte. Few atypical lymphs. No blasts.  Increase platelets.  Few large platelets. Well granulated.  Pathology:none    Assessment and Plan: 33yo female with chronic leukocytosis and thrombocytosis.  I believe the etilogy is her splenectomy back  in 2008.  I do NOT see any evidence of MPN or acute leukemia. Her counts will fluctuate with any stress or infection.   I did send off JAK2 assay.  I doubt CML.   I see no role for BM Bx. I reviewed the labs with her.  She feels relieved.  I did make sure that she gets Pneumovax every 3 yrs. She is still smoking.  I warned her of this and consequences of tobacco use. She understands. No need for CXR Will plan for f/u in 3-4 months. I spent a good 45 minutes with her.

## 2013-08-29 ENCOUNTER — Telehealth: Payer: Self-pay | Admitting: Hematology & Oncology

## 2013-08-29 LAB — IRON AND TIBC CHCC
%SAT: 23 % (ref 21–57)
Iron: 72 ug/dL (ref 41–142)
TIBC: 316 ug/dL (ref 236–444)
UIBC: 244 ug/dL (ref 120–384)

## 2013-08-29 LAB — FERRITIN CHCC: FERRITIN: 46 ng/mL (ref 9–269)

## 2013-08-29 NOTE — Telephone Encounter (Signed)
Pt aware of 6-5 appointment.

## 2013-08-30 LAB — HEMOGLOBINOPATHY EVALUATION
HGB A2 QUANT: 2.4 % (ref 2.2–3.2)
HGB F QUANT: 0 % (ref 0.0–2.0)
HGB S QUANTITAION: 0 %
Hemoglobin Other: 0 %
Hgb A: 97.6 % (ref 96.8–97.8)

## 2013-08-30 LAB — VITAMIN B12: Vitamin B-12: 874 pg/mL (ref 211–911)

## 2013-08-30 LAB — LACTATE DEHYDROGENASE: LDH: 120 U/L (ref 94–250)

## 2013-08-31 ENCOUNTER — Telehealth: Payer: Self-pay | Admitting: Nurse Practitioner

## 2013-08-31 NOTE — Telephone Encounter (Addendum)
Message copied by Jimmy Footman on Wed Aug 31, 2013  9:28 AM ------      Message from: Volanda Napoleon      Created: Tue Aug 30, 2013  4:15 PM       Call - labs are ok!! Laurey Arrow ------Pt verbalized understanding and appreciation.

## 2013-09-06 ENCOUNTER — Telehealth: Payer: Self-pay | Admitting: *Deleted

## 2013-09-06 NOTE — Telephone Encounter (Signed)
Message copied by Rico Ala on Tue Sep 06, 2013  2:08 PM ------      Message from: Burney Gauze R      Created: Fri Sep 02, 2013  6:18 AM       Call - genetic test is normal!!  No obvious bone marropw problem!!!  Pete ------

## 2013-09-06 NOTE — Telephone Encounter (Signed)
Called patient and left message on patients personal cell phone that genetic test is normal showing no obvious bone marrow problem

## 2013-09-16 ENCOUNTER — Encounter: Payer: Self-pay | Admitting: Hematology & Oncology

## 2013-11-25 ENCOUNTER — Ambulatory Visit (HOSPITAL_BASED_OUTPATIENT_CLINIC_OR_DEPARTMENT_OTHER): Payer: Medicaid Other | Admitting: Hematology & Oncology

## 2013-11-25 ENCOUNTER — Other Ambulatory Visit (HOSPITAL_BASED_OUTPATIENT_CLINIC_OR_DEPARTMENT_OTHER): Payer: Medicaid Other | Admitting: Lab

## 2013-11-25 ENCOUNTER — Encounter: Payer: Self-pay | Admitting: Hematology & Oncology

## 2013-11-25 VITALS — BP 124/80 | HR 77 | Temp 98.4°F | Resp 14 | Ht 61.0 in | Wt 188.0 lb

## 2013-11-25 DIAGNOSIS — D75839 Thrombocytosis, unspecified: Secondary | ICD-10-CM

## 2013-11-25 DIAGNOSIS — D45 Polycythemia vera: Secondary | ICD-10-CM

## 2013-11-25 DIAGNOSIS — D72829 Elevated white blood cell count, unspecified: Secondary | ICD-10-CM

## 2013-11-25 DIAGNOSIS — Z9089 Acquired absence of other organs: Secondary | ICD-10-CM

## 2013-11-25 DIAGNOSIS — D473 Essential (hemorrhagic) thrombocythemia: Secondary | ICD-10-CM

## 2013-11-25 LAB — CBC WITH DIFFERENTIAL (CANCER CENTER ONLY)
BASO#: 0.1 10*3/uL (ref 0.0–0.2)
BASO%: 0.4 % (ref 0.0–2.0)
EOS%: 4.1 % (ref 0.0–7.0)
Eosinophils Absolute: 0.6 10*3/uL — ABNORMAL HIGH (ref 0.0–0.5)
HCT: 42 % (ref 34.8–46.6)
HGB: 14.4 g/dL (ref 11.6–15.9)
LYMPH#: 4.9 10*3/uL — ABNORMAL HIGH (ref 0.9–3.3)
LYMPH%: 36.5 % (ref 14.0–48.0)
MCH: 32.1 pg (ref 26.0–34.0)
MCHC: 34.3 g/dL (ref 32.0–36.0)
MCV: 94 fL (ref 81–101)
MONO#: 1.1 10*3/uL — AB (ref 0.1–0.9)
MONO%: 8 % (ref 0.0–13.0)
NEUT#: 6.9 10*3/uL — ABNORMAL HIGH (ref 1.5–6.5)
NEUT%: 51 % (ref 39.6–80.0)
PLATELETS: 440 10*3/uL — AB (ref 145–400)
RBC: 4.49 10*6/uL (ref 3.70–5.32)
RDW: 13.8 % (ref 11.1–15.7)
WBC: 13.4 10*3/uL — AB (ref 3.9–10.0)

## 2013-11-25 LAB — CHCC SATELLITE - SMEAR

## 2013-11-25 LAB — LACTATE DEHYDROGENASE: LDH: 113 U/L (ref 94–250)

## 2013-11-25 NOTE — Patient Instructions (Signed)
You Can Quit Smoking If you are ready to quit smoking or are thinking about it, congratulations! You have chosen to help yourself be healthier and live longer! There are lots of different ways to quit smoking. Nicotine gum, nicotine patches, a nicotine inhaler, or nicotine nasal spray can help with physical craving. Hypnosis, support groups, and medicines help break the habit of smoking. TIPS TO GET OFF AND STAY OFF CIGARETTES  Learn to predict your moods. Do not let a bad situation be your excuse to have a cigarette. Some situations in your life might tempt you to have a cigarette.  Ask friends and co-workers not to smoke around you.  Make your home smoke-free.  Never have "just one" cigarette. It leads to wanting another and another. Remind yourself of your decision to quit.  On a card, make a list of your reasons for not smoking. Read it at least the same number of times a day as you have a cigarette. Tell yourself everyday, "I do not want to smoke. I choose not to smoke."  Ask someone at home or work to help you with your plan to quit smoking.  Have something planned after you eat or have a cup of coffee. Take a walk or get other exercise to perk you up. This will help to keep you from overeating.  Try a relaxation exercise to calm you down and decrease your stress. Remember, you may be tense and nervous the first two weeks after you quit. This will pass.  Find new activities to keep your hands busy. Play with a pen, coin, or rubber band. Doodle or draw things on paper.  Brush your teeth right after eating. This will help cut down the craving for the taste of tobacco after meals. You can try mouthwash too.  Try gum, breath mints, or diet candy to keep something in your mouth. IF YOU SMOKE AND WANT TO QUIT:  Do not stock up on cigarettes. Never buy a carton. Wait until one pack is finished before you buy another.  Never carry cigarettes with you at work or at home.  Keep cigarettes  as far away from you as possible. Leave them with someone else.  Never carry matches or a lighter with you.  Ask yourself, "Do I need this cigarette or is this just a reflex?"  Bet with someone that you can quit. Put cigarette money in a piggy bank every morning. If you smoke, you give up the money. If you do not smoke, by the end of the week, you keep the money.  Keep trying. It takes 21 days to change a habit!  Talk to your doctor about using medicines to help you quit. These include nicotine replacement gum, lozenges, or skin patches. Document Released: 04/05/2009 Document Revised: 09/01/2011 Document Reviewed: 04/05/2009 [employer] Patient Information 2014 [employer].  

## 2013-11-25 NOTE — Progress Notes (Signed)
Hematology and Oncology Follow Up Visit  Christy Obrien [id number] 1980/08/02 32 y.o. 11/25/2013   Principle Diagnosis:   Chronic leukocytosis/thrombocytosis -s/p splenectomy  Current Therapy:    Observation     Interim History:  Ms.  Christy Obrien is back for a second office visit. We first saw her back in March. We did a fairly thorough workup on her. All of her tests came back negative. Most important,the JAK2 assay came back negative.  She still smoking. She's having no problems with fever or infections. She's having no problems with bleeding or rashes.  She has had no change with her medications.  She now has a new job.  Medications: Current outpatient prescriptions:albuterol (PROVENTIL,VENTOLIN) 90 MCG/ACT inhaler, Inhale 2 puffs into the lungs every 4 (four) hours as needed for wheezing., Disp: 17 g, Rfl: 2;  amphetamine-dextroamphetamine (ADDERALL XR) 20 MG 24 hr capsule, Take 20 mg by mouth daily. 20 mg. In evening, Disp: , Rfl: ;  amphetamine-dextroamphetamine (ADDERALL XR) 25 MG 24 hr capsule, Take 25 mg by mouth every morning., Disp: , Rfl:  ARIPiprazole (ABILIFY MAINTENA) 400 MG SUSR, Inject into the muscle every 30 (thirty) days., Disp: , Rfl: ;  escitalopram (LEXAPRO) 10 MG tablet, Take 10 mg by mouth once a week., Disp: , Rfl: ;  hydrOXYzine (VISTARIL) 50 MG capsule, Take 50 mg by mouth as needed. , Disp: , Rfl: ;  levothyroxine (SYNTHROID) 150 MCG tablet, Take 150 mcg by mouth daily before breakfast., Disp: , Rfl:   Allergies:  Allergies  Allergen Reactions  . Barium Sulfate Nausea And Vomiting    Must be treated with zofran less than 5 mins before per patient  . Ivp Dye [Iodinated Diagnostic Agents] Nausea And Vomiting    Must be treated with zofran less than 5 mins before per patient    . Morphine And Related     :"Evil"  . Penicillins   . Lamictal [Lamotrigine] Rash    Past Medical History, Surgical history, Social history, and Family History were reviewed and  updated.  Review of Systems: As above  Physical Exam:  height is 5\' 1"  (1.549 m) and weight is 188 lb (85.276 kg). Her oral temperature is 98.4 F (36.9 C). Her blood pressure is 124/80 and her pulse is 77. Her respiration is 14.   Well-developed and well-nourished white female. Her head and neck exam his doctor or oral lesions. She has no palpable cervical or supraclavicular lymph nodes. Lungs are clear bilaterally. Cardiac exam is regular rate rhythm with no murmurs rubs or bruits. Abdomen is soft. She has a well-healed laparotomy scar from her splenectomy. There is no abdominal mass. There is no palpable liver. Extremities shows no clubbing cyanosis or edema. Skin exam no rashes. Neurological exam is non-focal.  Lab Results  Component Value Date   WBC 13.4* 11/25/2013   HGB 14.4 11/25/2013   HCT 42.0 11/25/2013   MCV 94 11/25/2013   PLT 440* 11/25/2013     Chemistry      Component Value Date/Time   NA 138 03/04/2012 0914   K 3.7 03/04/2012 0914   CL 102 03/04/2012 0914   CO2 26 03/04/2012 0914   BUN 11 03/04/2012 0914   CREATININE 0.80 03/04/2012 0914   CREATININE 0.97 12/18/2010 1538      Component Value Date/Time   CALCIUM 9.2 03/04/2012 0914   ALKPHOS 63 03/04/2012 0914   AST 14 03/04/2012 0914   ALT 7 03/04/2012 0914   BILITOT 0.6 03/04/2012 0914  Impression and Plan: Christy Obrien is 33 year old white female with chronic leukocytosis and thrombocytosis. She has had her spleen removed.  I really believe that her leukocytosis and thrombocytosis is partly, if not all from the splenectomy. At that she always will have a mild degree of leukocytosis and thrombocytosis. Whenever she gets stressed or if she develops an infection, she will have an exacerbation of her blood counts.  The fact that her JAK2 assay was normal is very reassuring.  I looked at her blood smear. I did not see anything on the blood smear that looked suspicious for a myeloproliferative process.  I really don't  think that we have to get her back. She is a very interesting woman. It is fine to talk to her about her job.  I reviewed the lab work with her. I went over my recommendations. I told her that we would be more than happy to see her back if necessary.i spent  a good half hour with her.   Volanda Napoleon, MD 6/5/20152:20 PM

## 2014-01-24 ENCOUNTER — Emergency Department (HOSPITAL_COMMUNITY): Payer: Medicaid Other

## 2014-01-24 ENCOUNTER — Encounter (HOSPITAL_COMMUNITY): Payer: Self-pay | Admitting: Emergency Medicine

## 2014-01-24 ENCOUNTER — Emergency Department (HOSPITAL_COMMUNITY)
Admission: EM | Admit: 2014-01-24 | Discharge: 2014-01-25 | Disposition: A | Discharge status: Home / Self Care | Payer: Medicaid Other | Attending: Emergency Medicine | Admitting: Emergency Medicine

## 2014-01-24 DIAGNOSIS — Z23 Encounter for immunization: Secondary | ICD-10-CM | POA: Diagnosis not present

## 2014-01-24 DIAGNOSIS — Z880 Allergy status to penicillin: Secondary | ICD-10-CM | POA: Diagnosis not present

## 2014-01-24 DIAGNOSIS — M25579 Pain in unspecified ankle and joints of unspecified foot: Secondary | ICD-10-CM | POA: Insufficient documentation

## 2014-01-24 DIAGNOSIS — Z79899 Other long term (current) drug therapy: Secondary | ICD-10-CM | POA: Diagnosis not present

## 2014-01-24 DIAGNOSIS — F909 Attention-deficit hyperactivity disorder, unspecified type: Secondary | ICD-10-CM | POA: Insufficient documentation

## 2014-01-24 DIAGNOSIS — L02419 Cutaneous abscess of limb, unspecified: Secondary | ICD-10-CM | POA: Insufficient documentation

## 2014-01-24 DIAGNOSIS — F17200 Nicotine dependence, unspecified, uncomplicated: Secondary | ICD-10-CM | POA: Diagnosis not present

## 2014-01-24 DIAGNOSIS — L03119 Cellulitis of unspecified part of limb: Principal | ICD-10-CM

## 2014-01-24 DIAGNOSIS — F319 Bipolar disorder, unspecified: Secondary | ICD-10-CM | POA: Diagnosis not present

## 2014-01-24 DIAGNOSIS — Z87442 Personal history of urinary calculi: Secondary | ICD-10-CM | POA: Diagnosis not present

## 2014-01-24 DIAGNOSIS — J45909 Unspecified asthma, uncomplicated: Secondary | ICD-10-CM | POA: Diagnosis not present

## 2014-01-24 DIAGNOSIS — F411 Generalized anxiety disorder: Secondary | ICD-10-CM | POA: Diagnosis not present

## 2014-01-24 DIAGNOSIS — Z9089 Acquired absence of other organs: Secondary | ICD-10-CM | POA: Insufficient documentation

## 2014-01-24 DIAGNOSIS — E039 Hypothyroidism, unspecified: Secondary | ICD-10-CM | POA: Insufficient documentation

## 2014-01-24 DIAGNOSIS — L03115 Cellulitis of right lower limb: Secondary | ICD-10-CM

## 2014-01-24 LAB — CBC WITH DIFFERENTIAL/PLATELET
BASOS ABS: 0.1 10*3/uL (ref 0.0–0.1)
Basophils Relative: 0 % (ref 0–1)
Eosinophils Absolute: 0.2 10*3/uL (ref 0.0–0.7)
Eosinophils Relative: 1 % (ref 0–5)
HEMATOCRIT: 40.8 % (ref 36.0–46.0)
Hemoglobin: 13.9 g/dL (ref 12.0–15.0)
LYMPHS PCT: 18 % (ref 12–46)
Lymphs Abs: 3.7 10*3/uL (ref 0.7–4.0)
MCH: 32 pg (ref 26.0–34.0)
MCHC: 34.1 g/dL (ref 30.0–36.0)
MCV: 94 fL (ref 78.0–100.0)
Monocytes Absolute: 1.9 10*3/uL — ABNORMAL HIGH (ref 0.1–1.0)
Monocytes Relative: 9 % (ref 3–12)
NEUTROS ABS: 14.5 10*3/uL — AB (ref 1.7–7.7)
Neutrophils Relative %: 72 % (ref 43–77)
PLATELETS: 443 10*3/uL — AB (ref 150–400)
RBC: 4.34 MIL/uL (ref 3.87–5.11)
RDW: 13.4 % (ref 11.5–15.5)
WBC: 20.3 10*3/uL — ABNORMAL HIGH (ref 4.0–10.5)

## 2014-01-24 LAB — SEDIMENTATION RATE: SED RATE: 8 mm/h (ref 0–22)

## 2014-01-24 MED ORDER — FLUCONAZOLE 150 MG PO TABS: 150.0000 mg | ORAL_TABLET | Freq: Once | ORAL | Status: DC

## 2014-01-24 MED ORDER — CLINDAMYCIN HCL 150 MG PO CAPS: 300.0000 mg | ORAL_CAPSULE | Freq: Three times a day (TID) | ORAL | Status: DC

## 2014-01-24 MED ORDER — TRAMADOL HCL 50 MG PO TABS: 50.0000 mg | ORAL_TABLET | Freq: Four times a day (QID) | ORAL | Status: DC | PRN

## 2014-01-24 MED ORDER — CLINDAMYCIN PHOSPHATE 600 MG/50ML IV SOLN
600.0000 mg | Freq: Once | INTRAVENOUS | Status: AC
  Administered 2014-01-24: 600 mg via INTRAVENOUS
  Filled 2014-01-24: qty 50

## 2014-01-24 MED ORDER — TETANUS-DIPHTH-ACELL PERTUSSIS 5-2.5-18.5 LF-MCG/0.5 IM SUSP
0.5000 mL | Freq: Once | INTRAMUSCULAR | Status: AC
  Administered 2014-01-24: 0.5 mL via INTRAMUSCULAR
  Filled 2014-01-24: qty 0.5

## 2014-01-24 MED ORDER — SODIUM CHLORIDE 0.9 % IV BOLUS (SEPSIS)
1000.0000 mL | Freq: Once | INTRAVENOUS | Status: AC
  Administered 2014-01-24: 1000 mL via INTRAVENOUS

## 2014-01-24 MED ORDER — HYDROCODONE-ACETAMINOPHEN 5-325 MG PO TABS
2.0000 | ORAL_TABLET | Freq: Once | ORAL | Status: AC
Start: 2014-01-24 — End: 2014-01-24
  Administered 2014-01-24: 2 via ORAL
  Filled 2014-01-24: qty 2

## 2014-01-24 NOTE — ED Notes (Signed)
Pt got a tattoo on her rt ankle on Friday and has since had some pain, and greenish yellow drainage. Pt rates pain 7/10, states when she puts weight on the ankle pain goes to a 9.

## 2014-01-24 NOTE — ED Provider Notes (Signed)
CSN: [id number]     Arrival date & time 01/24/14  1803 History   First MD Initiated Contact with Patient 01/24/14 2044     Chief Complaint  Patient presents with  . Ankle Pain    (Consider location/radiation/quality/duration/timing/severity/associated sxs/prior Treatment) HPI Comments: Patient is a 33 year old female who presents to the emergency department for right ankle pain. Patient states that she received a tattoo to her right ankle 4 days ago. She states that 2 days ago she noticed that her ankle was becoming painful and erythematous. She states that symptoms have been gradually worsening since onset. Patient now has a constant, dull and intense pain in her right ankle which radiates up her right leg. Symptoms associated with soft tissue swelling, heat to touch, as well as small amount of yellow serous drainage. No relief from [employer] and Motrin. Patient denies associated red streaking, fevers, chills, numbness, and extremity weakness. Last Tdap unknown.  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 States she has a baseline leukocytosis and thrombocytosis; last WBC 13.9 on 07/11/13 during routine lab draw.  Patient is a 33 y.o. female presenting with ankle pain. The history is provided by the patient. No language interpreter was used.  Ankle Pain Associated symptoms: no fever     Past Medical History  Diagnosis Date  . Bipolar 1 disorder   . Gastritis   . Asthma   . ADD (attention deficit disorder with hyperactivity)   . High cholesterol   . Kidney stones   . Hypothyroid     post ablation for graves  . Anxiety     general anxiety disorder  . Migraines    Past Surgical History  Procedure Laterality Date  . Partial pancriatectomy    . Spleenectomy    . Tubal ligation    . Spleenectomy    . Exploratory laparotomy    . Cholecystectomy    . Ablation colpoclesis    . Dilitation & currettage/hystroscopy with essure      Family History  Problem Relation Age of Onset  . Hyperlipidemia Mother   . Hyperlipidemia Father   . Cancer Paternal Grandmother     breast   History  Substance Use Topics  . Smoking status: Current Some Day Smoker -- 1.00 packs/day for 10 years    Types: Cigarettes    Start date: 08/26/2001  . Smokeless tobacco: Never Used     Comment: still smoking  . Alcohol Use: No   OB History   Grav Para Term Preterm Abortions TAB SAB Ect Mult Living   4 1 1  0 3 0 3 0 0 1     Obstetric Comments   Pt does not have custody of her child due to her mental illness.      Review of Systems  Constitutional: Negative for fever and chills.  Musculoskeletal: Positive for joint swelling and myalgias.  Skin: Positive for color change.  Neurological: Negative for weakness and numbness.  All other systems reviewed and are negative.    Allergies  Barium sulfate; Ivp dye; Morphine and related; Penicillins; and Lamictal  Home Medications   Prior to Admission medications   Medication Sig Start Date End Date Taking? Authorizing Provider  albuterol (PROVENTIL HFA;VENTOLIN HFA) 108 (90 BASE) MCG/ACT inhaler Inhale 2 puffs into the lungs every 4 (four) hours as needed for wheezing or shortness of breath.   Yes Historical Provider, MD  amphetamine-dextroamphetamine (ADDERALL XR) 20 MG 24 hr capsule  Take 20 mg by mouth daily. 20 mg. In evening   Yes Historical Provider, MD  amphetamine-dextroamphetamine (ADDERALL XR) 25 MG 24 hr capsule Take 25 mg by mouth every morning.   Yes Historical Provider, MD  ARIPiprazole (ABILIFY MAINTENA) 400 MG SUSR Inject into the muscle every 30 (thirty) days.   Yes Historical Provider, MD  escitalopram (LEXAPRO) 10 MG tablet Take 10 mg by mouth once a week.   Yes Historical Provider, MD  hydrOXYzine (VISTARIL) 50 MG capsule Take 50 mg by mouth as needed for anxiety.    Yes Historical Provider, MD  levothyroxine (SYNTHROID) 150 MCG tablet Take 150 mcg by mouth daily  before breakfast.   Yes Historical Provider, MD  clindamycin (CLEOCIN) 150 MG capsule Take 2 capsules (300 mg total) by mouth 3 (three) times daily. May dispense as 150mg  capsules 01/24/14   Antonietta Breach, PA-C  fluconazole (DIFLUCAN) 150 MG tablet Take 1 tablet (150 mg total) by mouth once. 01/24/14   Antonietta Breach, PA-C  traMADol (ULTRAM) 50 MG tablet Take 1 tablet (50 mg total) by mouth every 6 (six) hours as needed. 01/24/14   Antonietta Breach, PA-C   BP 124/71  Pulse 96  Temp(Src) 98.8 F (37.1 C) (Oral)  Resp 16  SpO2 99%  Physical Exam  Nursing note and vitals reviewed. Constitutional: She is oriented to person, place, and time. She appears well-developed and well-nourished. No distress.  Nontoxic/nonseptic appearing  HENT:  Head: Normocephalic and atraumatic.  Eyes: Conjunctivae and EOM are normal. No scleral icterus.  Neck: Normal range of motion.  Cardiovascular: Normal rate, regular rhythm and intact distal pulses.   DP and PT pulses 2+ in right lower extremity  Pulmonary/Chest: Effort normal. No respiratory distress.  Musculoskeletal: Normal range of motion. She exhibits tenderness.       Legs: Tattoo noted to right medial lower extremity, just proximal to right ankle. There is associated erythema and heat to touch as well as soft tissue swelling surrounding tattoo and extending to the proximal aspect of right ankle. Tenderness to palpation appreciated without red linear streaking. There is a scant amount of serous drainage appreciable to tattoo site. Normal PROM of R ankle. No TTP that appears out of proportion to exam.   Neurological: She is alert and oriented to person, place, and time. She exhibits normal muscle tone. Coordination normal.  No gross sensory deficits appreciated. Patient able to wiggle all toes.  Skin: Skin is warm and dry. No rash noted. She is not diaphoretic. No erythema. No pallor.  Psychiatric: She has a normal mood and affect. Her behavior is normal.    ED Course   Procedures (including critical care time) Labs Review Labs Reviewed  CBC WITH DIFFERENTIAL - Abnormal; Notable for the following:    WBC 20.3 (*)    Platelets 443 (*)    Neutro Abs 14.5 (*)    Monocytes Absolute 1.9 (*)    All other components within normal limits  SEDIMENTATION RATE    Imaging Review Dg Ankle Complete Right  01/24/2014   CLINICAL DATA:  Cellulitis at site of new tattoo  EXAM: RIGHT ANKLE - COMPLETE 3+ VIEW  COMPARISON:  12/05/2012  FINDINGS: There is marked soft tissue swelling medial to the ankle. No radiopaque foreign body. No fracture, malalignment, or osseous erosion. Bone island in the anterior calcaneus.  IMPRESSION: Medial ankle soft tissue swelling.  No bony abnormalities.   Electronically Signed   By: Jorje Guild M.D.   On: 01/24/2014 23:26  EKG Interpretation None      MDM   Final diagnoses:  Cellulitis of right lower extremity    33 year old female presents to the emergency department for swelling of her right lower extremity at site of tattoo which she received 4 days ago. Physical exam consistent with cellulitis. Patient has a leukocytosis today consistent with cellulitis. Leukocytosis 20.3, increased from baseline WBC count of approximately 14. No fever, chills, hypotension, or red streaking. Physical exam does not suggest necrotizing fasciitis at this time and imaging without evidence of subcutaneous gas. Tdap updated.  Patient tx in ED with IV clindamycin. She will be discharged with prescription for same. Borders marked in ED with marking pen. Have advised patient to f/u for a recheck with her doctor in 48 hours, but to return to the ED if borders worsen or fever develops despite outpatient treatment with antibiotics for a full 24 hours. Patient verbalizes understanding and agreement with plan. Tramadol prescribed for pain control. VSS and patient discharged in good condition.   Filed Vitals:   01/24/14 1811 01/24/14 2050 01/24/14 2355   BP: 116/75 124/71 103/61  Pulse: 103 96 95  Temp: 98.8 F (37.1 C)  98.4 F (36.9 C)  TempSrc: Oral  Oral  Resp: 16  18  SpO2: 97% 99% 97%       Antonietta Breach, PA-C 01/25/14 0004

## 2014-01-24 NOTE — ED Notes (Signed)
Pt report that she had a tattoo done on Friday to the right ankle. States that the ankle is now swollen. Reports that she is unsure whether she is have a reaction to the dye or the area is infected. Reports increased pain with weight.

## 2014-01-24 NOTE — Discharge Instructions (Signed)
Cellulitis Cellulitis is an infection of the skin and the tissue beneath it. The infected area is usually red and tender. Cellulitis occurs most often in the arms and lower legs.  CAUSES  Cellulitis is caused by bacteria that enter the skin through cracks or cuts in the skin. The most common types of bacteria that cause cellulitis are staphylococci and streptococci. SIGNS AND SYMPTOMS   Redness and warmth.  Swelling.  Tenderness or pain.  Fever. DIAGNOSIS  Your health care provider can usually determine what is wrong based on a physical exam. Blood tests may also be done. TREATMENT  Treatment usually involves taking an antibiotic medicine. HOME CARE INSTRUCTIONS   Take your antibiotic medicine as directed by your health care provider. Finish the antibiotic even if you start to feel better.  Keep the infected arm or leg elevated to reduce swelling.  Apply a warm cloth to the affected area up to 4 times per day to relieve pain.  Take medicines only as directed by your health care provider.  Keep all follow-up visits as directed by your health care provider. SEEK MEDICAL CARE IF:   You notice red streaks coming from the infected area.  Your red area gets larger or turns dark in color.  Your bone or joint underneath the infected area becomes painful after the skin has healed.  Your infection returns in the same area or another area.  You notice a swollen bump in the infected area.  You develop new symptoms.  You have a fever. SEEK IMMEDIATE MEDICAL CARE IF:   You feel very sleepy.  You develop vomiting or diarrhea.  You have a general ill feeling (malaise) with muscle aches and pains. MAKE SURE YOU:   Understand these instructions.  Will watch your condition.  Will get help right away if you are not doing well or get worse. Document Released: 03/19/2005 Document Revised: 10/24/2013 Document Reviewed: 08/25/2011 [facility] Patient Information 2015 [place], [place].  This information is not intended to replace advice given to you by your health care provider. Make sure you discuss any questions you have with your health care provider. Wound Care Wound care helps prevent pain and infection.  You may need a tetanus shot if:  You cannot remember when you had your last tetanus shot.  You have never had a tetanus shot.  The injury broke your skin. If you need a tetanus shot and you choose not to have one, you may get tetanus. Sickness from tetanus can be serious. HOME CARE   Only take medicine as told by your doctor.  Clean the wound daily with mild soap and water.  Change any bandages (dressings) as told by your doctor.  Put medicated cream and a bandage on the wound as told by your doctor.  Change the bandage if it gets wet, dirty, or starts to smell.  Take showers. Do not take baths, swim, or do anything that puts your wound under water.  Rest and raise (elevate) the wound until the pain and puffiness (swelling) are better.  Keep all doctor visits as told. GET HELP RIGHT AWAY IF:   Yellowish-white fluid (pus) comes from the wound.  Medicine does not lessen your pain.  There is a red streak going away from the wound.  You have a fever. MAKE SURE YOU:   Understand these instructions.  Will watch your condition.  Will get help right away if you are not doing well or get worse. Document Released: 03/18/2008 Document Revised: 09/01/2011  Document Reviewed: 10/13/2010 [facility] Patient Information 2015 [place]. This information is not intended to replace advice given to you by your health care provider. Make sure you discuss any questions you have with your health care provider.

## 2014-01-25 NOTE — ED Provider Notes (Signed)
Medical screening examination/treatment/procedure(s) were performed by non-physician practitioner and as supervising physician I was immediately available for consultation/collaboration.   EKG Interpretation None       Varney Biles, MD 01/25/14 [phone]

## 2014-04-18 ENCOUNTER — Telehealth: Payer: Self-pay | Admitting: Hematology & Oncology

## 2014-04-18 NOTE — Telephone Encounter (Signed)
Pt left message needed to be seen. I left her message to call for appointment

## 2014-04-24 ENCOUNTER — Encounter (HOSPITAL_COMMUNITY): Payer: Self-pay | Admitting: Emergency Medicine

## 2014-05-09 ENCOUNTER — Other Ambulatory Visit: Payer: Self-pay | Admitting: *Deleted

## 2014-05-09 DIAGNOSIS — D72829 Elevated white blood cell count, unspecified: Secondary | ICD-10-CM

## 2014-05-10 ENCOUNTER — Ambulatory Visit (HOSPITAL_BASED_OUTPATIENT_CLINIC_OR_DEPARTMENT_OTHER): Payer: Medicaid Other | Admitting: Family

## 2014-05-10 ENCOUNTER — Encounter: Payer: Self-pay | Admitting: Family

## 2014-05-10 ENCOUNTER — Other Ambulatory Visit (HOSPITAL_BASED_OUTPATIENT_CLINIC_OR_DEPARTMENT_OTHER): Payer: Medicaid Other | Admitting: Lab

## 2014-05-10 VITALS — BP 126/84 | HR 92 | Temp 97.9°F | Resp 18 | Wt 187.0 lb

## 2014-05-10 DIAGNOSIS — D75839 Thrombocytosis, unspecified: Secondary | ICD-10-CM

## 2014-05-10 DIAGNOSIS — D72829 Elevated white blood cell count, unspecified: Secondary | ICD-10-CM

## 2014-05-10 DIAGNOSIS — D473 Essential (hemorrhagic) thrombocythemia: Secondary | ICD-10-CM

## 2014-05-10 LAB — LACTATE DEHYDROGENASE: LDH: 114 U/L (ref 94–250)

## 2014-05-10 LAB — COMPREHENSIVE METABOLIC PANEL
ALT: 16 U/L (ref 0–35)
AST: 17 U/L (ref 0–37)
Albumin: 4 g/dL (ref 3.5–5.2)
Alkaline Phosphatase: 76 U/L (ref 39–117)
BUN: 5 mg/dL — AB (ref 6–23)
CO2: 26 mEq/L (ref 19–32)
Calcium: 9.4 mg/dL (ref 8.4–10.5)
Chloride: 105 mEq/L (ref 96–112)
Creatinine, Ser: 0.71 mg/dL (ref 0.50–1.10)
GLUCOSE: 117 mg/dL — AB (ref 70–99)
POTASSIUM: 3.9 meq/L (ref 3.5–5.3)
Sodium: 140 mEq/L (ref 135–145)
TOTAL PROTEIN: 6.8 g/dL (ref 6.0–8.3)
Total Bilirubin: 0.5 mg/dL (ref 0.2–1.2)

## 2014-05-10 LAB — CBC WITH DIFFERENTIAL (CANCER CENTER ONLY)
BASO#: 0.1 10*3/uL (ref 0.0–0.2)
BASO%: 0.5 % (ref 0.0–2.0)
EOS ABS: 0.7 10*3/uL — AB (ref 0.0–0.5)
EOS%: 5.1 % (ref 0.0–7.0)
HCT: 39.1 % (ref 34.8–46.6)
HGB: 13.1 g/dL (ref 11.6–15.9)
LYMPH#: 5.4 10*3/uL — AB (ref 0.9–3.3)
LYMPH%: 39.2 % (ref 14.0–48.0)
MCH: 31.7 pg (ref 26.0–34.0)
MCHC: 33.5 g/dL (ref 32.0–36.0)
MCV: 95 fL (ref 81–101)
MONO#: 1 10*3/uL — ABNORMAL HIGH (ref 0.1–0.9)
MONO%: 7 % (ref 0.0–13.0)
NEUT%: 48.2 % (ref 39.6–80.0)
NEUTROS ABS: 6.6 10*3/uL — AB (ref 1.5–6.5)
Platelets: 517 10*3/uL — ABNORMAL HIGH (ref 145–400)
RBC: 4.13 10*6/uL (ref 3.70–5.32)
RDW: 13.5 % (ref 11.1–15.7)
WBC: 13.7 10*3/uL — ABNORMAL HIGH (ref 3.9–10.0)

## 2014-05-10 LAB — TECHNOLOGIST REVIEW CHCC SATELLITE

## 2014-05-10 LAB — VITAMIN B12: Vitamin B-12: 985 pg/mL — ABNORMAL HIGH (ref 211–911)

## 2014-05-10 NOTE — Progress Notes (Signed)
[place]  [phone] [phone]  ID: Christy Obrien OB: [birth year] MR#: [id number] XIP#:[id number] Patient Care Team: Miguel Aschoff, MD as PCP - General (Family Medicine)  DIAGNOSIS: Chronic leukocytosis/thrombocytosis -s/p splenectomy  INTERVAL HISTORY: Christy Obrien is here today for a follow-up. She was last seen in June. Her PCP was concerned with her elevated WBC and platelet count. Again, this is due to her splenectomy. Her work-up in March was all negative. She has had no issues with infections.  She denies fatigue, fever, chills. N/v, cough, rash, headache, dizziness, SOB, chest pain, palpitations, abdominal pain, constipation, diarrhea, blood in urine or stool. No bleeding or pain. She is still smoking.  No swelling, tenderness, numbness or tingling in her extremities.  Her appetite is good and she is drinking plenty of fluids. Her weight is stable.   She is working full time and enjoying spending time with her son.   CURRENT TREATMENT: Observation  REVIEW OF SYSTEMS: All other 10 point review of systems is negative.   PAST MEDICAL HISTORY: Past Medical History  Diagnosis Date  . Bipolar 1 disorder   . Gastritis   . Asthma   . ADD (attention deficit disorder with hyperactivity)   . High cholesterol   . Kidney stones   . Hypothyroid     post ablation for graves  . Anxiety     general anxiety disorder  . Migraines     PAST SURGICAL HISTORY: Past Surgical History  Procedure Laterality Date  . Partial pancriatectomy    . Spleenectomy    . Tubal ligation    . Spleenectomy    . Exploratory laparotomy    . Cholecystectomy    . Ablation colpoclesis    . Dilitation & currettage/hystroscopy with essure      FAMILY HISTORY Family History  Problem Relation Age of Onset  . Hyperlipidemia Mother   . Hyperlipidemia Father   . Cancer Paternal Grandmother     breast    GYNECOLOGIC HISTORY:  No LMP recorded. Patient has had an  ablation.   SOCIAL HISTORY: History   Social History  . Marital Status: Legally Separated    Spouse Name: N/A    Number of Children: N/A  . Years of Education: N/A   Occupational History  . Not on file.   Social History Main Topics  . Smoking status: Current Some Day Smoker -- 1.00 packs/day for 10 years    Types: Cigarettes    Start date: 08/26/2001  . Smokeless tobacco: Never Used     Comment: still smoking  . Alcohol Use: No  . Drug Use: Yes    Special: Marijuana  . Sexual Activity: Not Currently    Birth Control/ Protection: None   Other Topics Concern  . Not on file   Social History Narrative   Pt is not working has traveled around a lot in the last 3 years   Now living with a boyfriend sexually active uses protection.     ADVANCED DIRECTIVES:  <no information>  HEALTH MAINTENANCE: History  Substance Use Topics  . Smoking status: Current Some Day Smoker -- 1.00 packs/day for 10 years    Types: Cigarettes    Start date: 08/26/2001  . Smokeless tobacco: Never Used     Comment: still smoking  . Alcohol Use: No   Colonoscopy: PAP: Bone density: Lipid panel:  Allergies  Allergen Reactions  . Atorvastatin   . Barium Sulfate Nausea And Vomiting    Must be  treated with zofran less than 5 mins before per patient Must be treated with zofran less than 5 mins before per patient  . Ivp Dye [Iodinated Diagnostic Agents] Nausea And Vomiting    Must be treated with zofran less than 5 mins before per patient    . Metrizamide Nausea And Vomiting    Must be treated with zofran less than 5 mins before per patient   . Morphine And Related     :"Evil"  . Penicillins   . Lamictal [Lamotrigine] Rash    Current Outpatient Prescriptions  Medication Sig Dispense Refill  . albuterol (PROVENTIL HFA;VENTOLIN HFA) 108 (90 BASE) MCG/ACT inhaler Inhale 2 puffs into the lungs every 4 (four) hours as needed for wheezing or shortness of breath.    .  amphetamine-dextroamphetamine (ADDERALL XR) 25 MG 24 hr capsule Take 30 mg by mouth every morning.     Marland Kitchen amphetamine-dextroamphetamine (ADDERALL) 20 MG tablet Take 20 mg by mouth daily.    . ARIPiprazole (ABILIFY MAINTENA) 400 MG SUSR Inject into the muscle every 30 (thirty) days.    Marland Kitchen escitalopram (LEXAPRO) 10 MG tablet Take 10 mg by mouth once a week.    . hydrOXYzine (VISTARIL) 50 MG capsule Take 50 mg by mouth as needed for anxiety.     Marland Kitchen levothyroxine (SYNTHROID) 150 MCG tablet Take 125 mcg by mouth daily before breakfast.     . tiotropium (SPIRIVA) 18 MCG inhalation capsule Place 18 mcg into inhaler and inhale daily.    . traMADol (ULTRAM) 50 MG tablet Take 1 tablet (50 mg total) by mouth every 6 (six) hours as needed. 15 tablet 0   No current facility-administered medications for this visit.    OBJECTIVE: Filed Vitals:   05/10/14 1442  BP: 126/84  Pulse: 92  Temp: 97.9 F (36.6 C)  Resp: 18    Filed Weights   05/10/14 1442  Weight: 187 lb (84.823 kg)   ECOG FS:0 - Asymptomatic Ocular: Sclerae unicteric, pupils equal, round and reactive to light Ear-nose-throat: Oropharynx clear, dentition fair Lymphatic: No cervical or supraclavicular adenopathy Lungs no rales or rhonchi, good excursion bilaterally Heart regular rate and rhythm, no murmur appreciated Abd soft, nontender, positive bowel sounds MSK no focal spinal tenderness, no joint edema Neuro: non-focal, well-oriented, appropriate affect Breast: Deferred  LAB RESULTS: CMP     Component Value Date/Time   NA 138 03/04/2012 0914   K 3.7 03/04/2012 0914   CL 102 03/04/2012 0914   CO2 26 03/04/2012 0914   GLUCOSE 95 03/04/2012 0914   BUN 11 03/04/2012 0914   CREATININE 0.80 03/04/2012 0914   CREATININE 0.97 12/18/2010 1538   CALCIUM 9.2 03/04/2012 0914   PROT 7.1 03/04/2012 0914   ALBUMIN 3.8 03/04/2012 0914   AST 14 03/04/2012 0914   ALT 7 03/04/2012 0914   ALKPHOS 63 03/04/2012 0914   BILITOT 0.6 03/04/2012  0914   GFRNONAA >90 03/04/2012 0914   GFRAA >90 03/04/2012 0914   INo results found for: SPEP, UPEP Lab Results  Component Value Date   WBC 13.7* 05/10/2014   NEUTROABS 6.6* 05/10/2014   HGB 13.1 05/10/2014   HCT 39.1 05/10/2014   MCV 95 05/10/2014   PLT 517* 05/10/2014   No results found for: LABCA2 No components found for: LABCA125 No results for input(s): INR in the last 168 hours.  STUDIES: No results found.  ASSESSMENT/PLAN: Christy Obrien is 33 year old white female with chronic leukocytosis and thrombocytosis. She has had her spleen removed. Her  leukocytosis and thrombocytosis is partly, if not completely from the splenectomy. She always will have a mild degree of leukocytosis and thrombocytosis. Whenever she gets stressed or if she develops an infection, she will have an exacerbation of her blood counts. She has had no issues with infection.  Her work-up in March was negative. Her WBC today is 13.7 and platelets 517. She is asymptomatic at this time.  I do not feel that we need to see her back at this time.  She knows to call here with any questions or concerns and to go to the ED in the event of an emergency. We can certainly see her at any time she might need [place].   Christy Bottom, NP 05/10/2014 4:20 PM

## 2014-05-11 LAB — IRON AND TIBC CHCC
%SAT: 21 % (ref 21–57)
Iron: 56 ug/dL (ref 41–142)
TIBC: 265 ug/dL (ref 236–444)
UIBC: 210 ug/dL (ref 120–384)

## 2014-05-11 LAB — FERRITIN CHCC: FERRITIN: 47 ng/mL (ref 9–269)

## 2014-05-24 ENCOUNTER — Emergency Department (HOSPITAL_COMMUNITY): Payer: Medicaid Other

## 2014-05-24 ENCOUNTER — Encounter (HOSPITAL_COMMUNITY): Payer: Self-pay | Admitting: *Deleted

## 2014-05-24 ENCOUNTER — Emergency Department (HOSPITAL_COMMUNITY)
Admission: EM | Admit: 2014-05-24 | Discharge: 2014-05-24 | Disposition: A | Discharge status: Home / Self Care | Payer: Medicaid Other | Attending: Emergency Medicine | Admitting: Emergency Medicine

## 2014-05-24 DIAGNOSIS — Z720 Tobacco use: Secondary | ICD-10-CM | POA: Diagnosis not present

## 2014-05-24 DIAGNOSIS — Z880 Allergy status to penicillin: Secondary | ICD-10-CM | POA: Diagnosis not present

## 2014-05-24 DIAGNOSIS — R05 Cough: Secondary | ICD-10-CM

## 2014-05-24 DIAGNOSIS — F909 Attention-deficit hyperactivity disorder, unspecified type: Secondary | ICD-10-CM | POA: Insufficient documentation

## 2014-05-24 DIAGNOSIS — J45901 Unspecified asthma with (acute) exacerbation: Secondary | ICD-10-CM | POA: Diagnosis not present

## 2014-05-24 DIAGNOSIS — Z8719 Personal history of other diseases of the digestive system: Secondary | ICD-10-CM | POA: Insufficient documentation

## 2014-05-24 DIAGNOSIS — R509 Fever, unspecified: Secondary | ICD-10-CM | POA: Insufficient documentation

## 2014-05-24 DIAGNOSIS — R51 Headache: Secondary | ICD-10-CM | POA: Diagnosis not present

## 2014-05-24 DIAGNOSIS — Z79899 Other long term (current) drug therapy: Secondary | ICD-10-CM | POA: Diagnosis not present

## 2014-05-24 DIAGNOSIS — R059 Cough, unspecified: Secondary | ICD-10-CM

## 2014-05-24 DIAGNOSIS — F419 Anxiety disorder, unspecified: Secondary | ICD-10-CM | POA: Diagnosis not present

## 2014-05-24 DIAGNOSIS — E039 Hypothyroidism, unspecified: Secondary | ICD-10-CM | POA: Diagnosis not present

## 2014-05-24 DIAGNOSIS — Z87442 Personal history of urinary calculi: Secondary | ICD-10-CM | POA: Insufficient documentation

## 2014-05-24 DIAGNOSIS — F319 Bipolar disorder, unspecified: Secondary | ICD-10-CM | POA: Diagnosis not present

## 2014-05-24 LAB — CBC WITH DIFFERENTIAL/PLATELET
BASOS PCT: 1 % (ref 0–1)
Basophils Absolute: 0.2 10*3/uL — ABNORMAL HIGH (ref 0.0–0.1)
EOS PCT: 7 % — AB (ref 0–5)
Eosinophils Absolute: 1.1 10*3/uL — ABNORMAL HIGH (ref 0.0–0.7)
HCT: 39 % (ref 36.0–46.0)
HEMOGLOBIN: 13.6 g/dL (ref 12.0–15.0)
Lymphocytes Relative: 39 % (ref 12–46)
Lymphs Abs: 5.9 10*3/uL — ABNORMAL HIGH (ref 0.7–4.0)
MCH: 32.5 pg (ref 26.0–34.0)
MCHC: 34.9 g/dL (ref 30.0–36.0)
MCV: 93.1 fL (ref 78.0–100.0)
MONO ABS: 1.2 10*3/uL — AB (ref 0.1–1.0)
MONOS PCT: 8 % (ref 3–12)
NEUTROS PCT: 45 % (ref 43–77)
Neutro Abs: 6.6 10*3/uL (ref 1.7–7.7)
Platelets: 437 10*3/uL — ABNORMAL HIGH (ref 150–400)
RBC: 4.19 MIL/uL (ref 3.87–5.11)
RDW: 13.5 % (ref 11.5–15.5)
WBC: 15 10*3/uL — AB (ref 4.0–10.5)

## 2014-05-24 LAB — BASIC METABOLIC PANEL
ANION GAP: 16 — AB (ref 5–15)
BUN: 5 mg/dL — ABNORMAL LOW (ref 6–23)
CALCIUM: 9.6 mg/dL (ref 8.4–10.5)
CO2: 24 meq/L (ref 19–32)
Chloride: 100 mEq/L (ref 96–112)
Creatinine, Ser: 0.61 mg/dL (ref 0.50–1.10)
GFR calc Af Amer: >90 mL/min (ref >90)
GFR calc non Af Amer: >90 mL/min (ref >90)
Glucose, Bld: 96 mg/dL (ref 70–99)
POTASSIUM: 4 meq/L (ref 3.7–5.3)
Sodium: 140 mEq/L (ref 137–147)

## 2014-05-24 MED ORDER — IPRATROPIUM-ALBUTEROL 0.5-2.5 (3) MG/3ML IN SOLN
3.0000 mL | Freq: Once | RESPIRATORY_TRACT | Status: AC
  Administered 2014-05-24: 3 mL via RESPIRATORY_TRACT
  Filled 2014-05-24: qty 3

## 2014-05-24 MED ORDER — DEXAMETHASONE SODIUM PHOSPHATE 10 MG/ML IJ SOLN
10.0000 mg | Freq: Once | INTRAMUSCULAR | Status: AC
  Administered 2014-05-24: 10 mg via INTRAMUSCULAR
  Filled 2014-05-24: qty 1

## 2014-05-24 MED ORDER — ACETAMINOPHEN 325 MG PO TABS
650.0000 mg | ORAL_TABLET | Freq: Once | ORAL | Status: AC
  Administered 2014-05-24: 650 mg via ORAL
  Filled 2014-05-24: qty 2

## 2014-05-24 NOTE — ED Provider Notes (Signed)
CSN: [id number]     Arrival date & time 05/24/14  3295 History   First MD Initiated Contact with Patient 05/24/14 [phone]     Chief Complaint  Patient presents with  . Cough  . Chills  . Shortness of Breath     (Consider location/radiation/quality/duration/timing/severity/associated sxs/prior Treatment) HPI  Christy Obrien is a 33 y.o. female with PMH of asthma, bipolar, anxiety presenting with low-grade fever, sore throat, runny nose, difficulty breathing. She states her cough became productive yesterday with some thickness but no color. She presented to her primary care 3 days ago and chest x-ray with signs of early bronchitis. She was given a Z-Pak and has taken 3 days worth with 2 more days left. Patient presented today with complaint of shortness of breath that started last night. Worse with ambulation. She says in 2010 she was admitted for bilateral pneumonia with pneumothorax. She is concerned with that today. Pt with history of asthma and has taken albuterol and spiriva. She has not taken this today. Should also endorses sharp left-sided chest tightness after coughing fits. Patient without history of heart problems or chest pain. Patient denies hemoptysis, recent trauma or surgeries, unilateral leg swelling, history of DVT or PE. Patient denies any OCP use or estrogen use. Patient denies history of hypertension and endorses high cholesterol levels. She has a smoking history. She endorses mild headache. Patient notes side effect of increased swelling and larynx and decreased feeling of well being after taking prednisone.   Past Medical History  Diagnosis Date  . Bipolar 1 disorder   . Gastritis   . Asthma   . ADD (attention deficit disorder with hyperactivity)   . High cholesterol   . Kidney stones   . Hypothyroid     post ablation for graves  . Anxiety     general anxiety disorder  . Migraines    Past Surgical History  Procedure Laterality Date  . Partial pancriatectomy    .  Spleenectomy    . Tubal ligation    . Spleenectomy    . Exploratory laparotomy    . Cholecystectomy    . Ablation colpoclesis    . Dilitation & currettage/hystroscopy with essure     Family History  Problem Relation Age of Onset  . Hyperlipidemia Mother   . Hyperlipidemia Father   . Cancer Paternal Grandmother     breast   History  Substance Use Topics  . Smoking status: Current Some Day Smoker -- 1.00 packs/day for 10 years    Types: Cigarettes    Start date: 08/26/2001  . Smokeless tobacco: Never Used     Comment: still smoking  . Alcohol Use: No   OB History    Gravida Para Term Preterm AB TAB SAB Ectopic Multiple Living   4 1 1  0 3 0 3 0 0 1      Obstetric Comments   Pt does not have custody of her child due to her mental illness.     Review of Systems  Constitutional: Positive for fever. Negative for chills.  HENT: Positive for rhinorrhea and sore throat. Negative for congestion.   Eyes: Negative for visual disturbance.  Respiratory: Positive for cough and shortness of breath.   Cardiovascular: Negative for palpitations.       Chest tightness  Gastrointestinal: Negative for nausea, vomiting, abdominal pain and diarrhea.  Musculoskeletal: Negative for back pain.  Skin: Negative for color change and rash.  Neurological: Positive for headaches. Negative for weakness.  Allergies  Atorvastatin; Barium sulfate; Ivp dye; Metrizamide; Morphine and related; Penicillins; and Lamictal  Home Medications   Prior to Admission medications   Medication Sig Start Date End Date Taking? Authorizing Provider  albuterol (PROVENTIL HFA;VENTOLIN HFA) 108 (90 BASE) MCG/ACT inhaler Inhale 2 puffs into the lungs every 4 (four) hours as needed for wheezing or shortness of breath.    Historical Provider, MD  amphetamine-dextroamphetamine (ADDERALL XR) 25 MG 24 hr capsule Take 30 mg by mouth every morning.     Historical Provider, MD  amphetamine-dextroamphetamine (ADDERALL) 20 MG  tablet Take 20 mg by mouth daily.    Historical Provider, MD  ARIPiprazole (ABILIFY MAINTENA) 400 MG SUSR Inject into the muscle every 30 (thirty) days.    Historical Provider, MD  escitalopram (LEXAPRO) 10 MG tablet Take 10 mg by mouth once a week.    Historical Provider, MD  hydrOXYzine (VISTARIL) 50 MG capsule Take 50 mg by mouth as needed for anxiety.     Historical Provider, MD  levothyroxine (SYNTHROID) 150 MCG tablet Take 125 mcg by mouth daily before breakfast.     Historical Provider, MD  tiotropium (SPIRIVA) 18 MCG inhalation capsule Place 18 mcg into inhaler and inhale daily.    Historical Provider, MD  traMADol (ULTRAM) 50 MG tablet Take 1 tablet (50 mg total) by mouth every 6 (six) hours as needed. 01/24/14   Antonietta Breach, PA-C   BP 122/87 mmHg  Pulse 93  Temp(Src) 98 F (36.7 C) (Oral)  Resp 26  SpO2 96% Physical Exam  Constitutional: She appears well-developed and well-nourished. No distress.  HENT:  Head: Normocephalic and atraumatic.  Nose: Right sinus exhibits no maxillary sinus tenderness and no frontal sinus tenderness. Left sinus exhibits no maxillary sinus tenderness and no frontal sinus tenderness.  Mouth/Throat: Mucous membranes are normal. Posterior oropharyngeal edema and posterior oropharyngeal erythema present. No oropharyngeal exudate.  Eyes: Conjunctivae and EOM are normal. Right eye exhibits no discharge. Left eye exhibits no discharge.  Neck: Normal range of motion. Neck supple.  Cardiovascular: Normal rate, regular rhythm and normal heart sounds.   Pulmonary/Chest: Effort normal.  Mild diffuse wheezing bilaterally. Decreased air movement sounds equal bilaterally. No increased work of breathing. No Accessory muscle use. No respiratory distress.  Abdominal: Soft. Bowel sounds are normal. She exhibits no distension. There is no tenderness.  Lymphadenopathy:    She has cervical adenopathy.  Neurological: She is alert.  Skin: Skin is warm and dry. She is not  diaphoretic.  Nursing note and vitals reviewed.   ED Course  Procedures (including critical care time) Labs Review Labs Reviewed  CBC WITH DIFFERENTIAL - Abnormal; Notable for the following:    WBC 15.0 (*)    Platelets 437 (*)    Eosinophils Relative 7 (*)    Lymphs Abs 5.9 (*)    Monocytes Absolute 1.2 (*)    Eosinophils Absolute 1.1 (*)    Basophils Absolute 0.2 (*)    All other components within normal limits  BASIC METABOLIC PANEL - Abnormal; Notable for the following:    BUN 5 (*)    Anion gap 16 (*)    All other components within normal limits    Imaging Review Dg Chest 2 View  05/24/2014   CLINICAL DATA:  Shortness of breath today. Intermittent cough and fever for 1 week. Initial encounter.  EXAM: CHEST  2 VIEW  COMPARISON:  Radiographs 04/28/2011.  FINDINGS: The heart size and mediastinal contours are normal. The lungs are clear. There  is no pleural effusion or pneumothorax. No acute osseous findings are identified. There are stable surgical clips within the upper abdomen.  IMPRESSION: Stable examination.  No active cardiopulmonary process.   Electronically Signed   By: Camie Patience M.D.   On: 05/24/2014 10:59     EKG Interpretation   Date/Time:  Wednesday May 24 2014 09:33:11 EST Ventricular Rate:  115 PR Interval:  103 QRS Duration: 78 QT Interval:  338 QTC Calculation: 467 R Axis:   64 Text Interpretation:  Age not entered, assumed to be  33 years old for  purpose of ECG interpretation Sinus tachycardia with irregular rate  Tachycardia new, morphology similar to prior Confirmed by Mingo Amber  MD,  [place] ([id number]) on 05/24/2014 9:43:39 AM      MDM   Final diagnoses:  Cough  Asthma exacerbation   Chest x-ray without infiltrates. Patient with asthma exacerbation in the context of a URI with element of reactive airway. Patient given Decadron and two duonebs with improvement of her symptoms. On exam patient still with diffuse wheezing but is moving air much  better. No respiratory distress. Patient ambulated in ED with oxygen saturations 99-100%. Patient to continue with her home inhalers. Pt to follow-up with her primary care provider.  Discussed return precautions with patient. Discussed all results and patient verbalizes understanding and agrees with plan.  Case has been discussed with Dr. Mingo Amber who agrees with the above plan and to discharge.      Pura Spice, PA-C 05/24/14 [place], MD 05/24/14 [phone]

## 2014-05-24 NOTE — Discharge Instructions (Signed)
Return to the emergency room with worsening of symptoms, new symptoms or with symptoms that are concerning, especially fevers, difficulty breathing, worsening shortness of breath, coughing up blood or thick colored sputum. Continue to take your home inhalers.  Please call your doctor for a followup appointment within 24-48 hours. When you talk to your doctor please let them know that you were seen in the emergency department and have them acquire all of your records so that they can discuss the findings with you and formulate a treatment plan to fully care for your new and ongoing problems.   Asthma, Acute Bronchospasm Acute bronchospasm caused by asthma is also referred to as an asthma attack. Bronchospasm means your air passages become narrowed. The narrowing is caused by inflammation and tightening of the muscles in the air tubes (bronchi) in your lungs. This can make it hard to breathe or cause you to wheeze and cough. CAUSES Possible triggers are:  Animal dander from the skin, hair, or feathers of animals.  Dust mites contained in house dust.  Cockroaches.  Pollen from trees or grass.  Mold.  Cigarette or tobacco smoke.  Air pollutants such as dust, household cleaners, hair sprays, aerosol sprays, paint fumes, strong chemicals, or strong odors.  Cold air or weather changes. Cold air may trigger inflammation. Winds increase molds and pollens in the air.  Strong emotions such as crying or laughing hard.  Stress.  Certain medicines such as aspirin or beta-blockers.  Sulfites in foods and drinks, such as dried fruits and wine.  Infections or inflammatory conditions, such as a flu, cold, or inflammation of the nasal membranes (rhinitis).  Gastroesophageal reflux disease (GERD). GERD is a condition where stomach acid backs up into your esophagus.  Exercise or strenuous activity. SIGNS AND SYMPTOMS   Wheezing.  Excessive coughing, particularly at night.  Chest  tightness.  Shortness of breath. DIAGNOSIS  Your health care provider will ask you about your medical history and perform a physical exam. A chest X-ray or blood testing may be performed to look for other causes of your symptoms or other conditions that may have triggered your asthma attack. TREATMENT  Treatment is aimed at reducing inflammation and opening up the airways in your lungs. Most asthma attacks are treated with inhaled medicines. These include quick relief or rescue medicines (such as bronchodilators) and controller medicines (such as inhaled corticosteroids). These medicines are sometimes given through an inhaler or a nebulizer. Systemic steroid medicine taken by mouth or given through an IV tube also can be used to reduce the inflammation when an attack is moderate or severe. Antibiotic medicines are only used if a bacterial infection is present.  HOME CARE INSTRUCTIONS   Rest.  Drink plenty of liquids. This helps the mucus to remain thin and be easily coughed up. Only use caffeine in moderation and do not use alcohol until you have recovered from your illness.  Do not smoke. Avoid being exposed to secondhand smoke.  You play a critical role in keeping yourself in good health. Avoid exposure to things that cause you to wheeze or to have breathing problems.  Keep your medicines up-to-date and available. Carefully follow your health care provider's treatment plan.  Take your medicine exactly as prescribed.  When pollen or pollution is bad, keep windows closed and use an air conditioner or go to places with air conditioning.  Asthma requires careful medical care. See your health care provider for a follow-up as advised. If you are more than [redacted]  weeks pregnant and you were prescribed any new medicines, let your obstetrician know about the visit and how you are doing. Follow up with your health care provider as directed.  After you have recovered from your asthma attack, make an  appointment with your outpatient doctor to talk about ways to reduce the likelihood of future attacks. If you do not have a doctor who manages your asthma, make an appointment with a primary care doctor to discuss your asthma. SEEK IMMEDIATE MEDICAL CARE IF:   You are getting worse.  You have trouble breathing. If severe, call your local emergency services (911 in the [place]  You develop chest pain or discomfort.  You are vomiting.  You are not able to keep fluids down.  You are coughing up yellow, green, brown, or bloody sputum.  You have a fever and your symptoms suddenly get worse.  You have trouble swallowing. MAKE SURE YOU:   Understand these instructions.  Will watch your condition.  Will get help right away if you are not doing well or get worse. Document Released: 09/24/2006 Document Revised: 06/14/2013 Document Reviewed: 12/15/2012 [facility] Patient Information 2015 [place], [place]. This information is not intended to replace advice given to you by your health care provider. Make sure you discuss any questions you have with your health care provider.   Asthma Attack Prevention Although there is no way to prevent asthma from starting, you can take steps to control the disease and reduce its symptoms. Learn about your asthma and how to control it. Take an active role to control your asthma by working with your health care provider to create and follow an asthma action plan. An asthma action plan guides you in:  Taking your medicines properly.  Avoiding things that set off your asthma or make your asthma worse (asthma triggers).  Tracking your level of asthma control.  Responding to worsening asthma.  Seeking emergency care when needed. To track your asthma, keep records of your symptoms, check your peak flow number using a handheld device that shows how well air moves out of your lungs (peak flow meter), and get regular asthma checkups.  WHAT ARE SOME WAYS TO PREVENT  AN ASTHMA ATTACK?  Take medicines as directed by your health care provider.  Keep track of your asthma symptoms and level of control.  With your health care provider, write a detailed plan for taking medicines and managing an asthma attack. Then be sure to follow your action plan. Asthma is an ongoing condition that needs regular monitoring and treatment.  Identify and avoid asthma triggers. Many outdoor allergens and irritants (such as pollen, mold, cold air, and air pollution) can trigger asthma attacks. Find out what your asthma triggers are and take steps to avoid them.  Monitor your breathing. Learn to recognize warning signs of an attack, such as coughing, wheezing, or shortness of breath. Your lung function may decrease before you notice any signs or symptoms, so regularly measure and record your peak airflow with a home peak flow meter.  Identify and treat attacks early. If you act quickly, you are less likely to have a severe attack. You will also need less medicine to control your symptoms. When your peak flow measurements decrease and alert you to an upcoming attack, take your medicine as instructed and immediately stop any activity that may have triggered the attack. If your symptoms do not improve, get medical help.  Pay attention to increasing quick-relief inhaler use. If you find yourself relying on  your quick-relief inhaler, your asthma is not under control. See your health care provider about adjusting your treatment. WHAT CAN MAKE MY SYMPTOMS WORSE? A number of common things can set off or make your asthma symptoms worse and cause temporary increased inflammation of your airways. Keep track of your asthma symptoms for several weeks, detailing all the environmental and emotional factors that are linked with your asthma. When you have an asthma attack, go back to your asthma diary to see which factor, or combination of factors, might have contributed to it. Once you know what these  factors are, you can take steps to control many of them. If you have allergies and asthma, it is important to take asthma prevention steps at home. Minimizing contact with the substance to which you are allergic will help prevent an asthma attack. Some triggers and ways to avoid these triggers are: Animal Dander:  Some people are allergic to the flakes of skin or dried saliva from animals with fur or feathers.   There is no such thing as a hypoallergenic dog or cat breed. All dogs or cats can cause allergies, even if they don't shed.  Keep these pets out of your home.  If you are not able to keep a pet outdoors, keep the pet out of your bedroom and other sleeping areas at all times, and keep the door closed.  Remove carpets and furniture covered with cloth from your home. If that is not possible, keep the pet away from fabric-covered furniture and carpets. Dust Mites: Many people with asthma are allergic to dust mites. Dust mites are tiny bugs that are found in every home in mattresses, pillows, carpets, fabric-covered furniture, bedcovers, clothes, stuffed toys, and other fabric-covered items.   Cover your mattress in a special dust-proof cover.  Cover your pillow in a special dust-proof cover, or wash the pillow each week in hot water. Water must be hotter than 130 F (54.4 C) to kill dust mites. Cold or warm water used with detergent and bleach can also be effective.  Wash the sheets and blankets on your bed each week in hot water.  Try not to sleep or lie on cloth-covered cushions.  Call ahead when traveling and ask for a smoke-free hotel room. Bring your own bedding and pillows in case the hotel only supplies feather pillows and down comforters, which may contain dust mites and cause asthma symptoms.  Remove carpets from your bedroom and those laid on concrete, if you can.  Keep stuffed toys out of the bed, or wash the toys weekly in hot water or cooler water with detergent and  bleach. Cockroaches: Many people with asthma are allergic to the droppings and remains of cockroaches.   Keep food and garbage in closed containers. Never leave food out.  Use poison baits, traps, powders, gels, or paste (for example, boric acid).  If a spray is used to kill cockroaches, stay out of the room until the odor goes away. Indoor Mold:  Fix leaky faucets, pipes, or other sources of water that have mold around them.  Clean floors and moldy surfaces with a fungicide or diluted bleach.  Avoid using humidifiers, vaporizers, or swamp coolers. These can spread molds through the air. Pollen and Outdoor Mold:  When pollen or mold spore counts are high, try to keep your windows closed.  Stay indoors with windows closed from late morning to afternoon. Pollen and some mold spore counts are highest at that time.  Ask your health care  provider whether you need to take anti-inflammatory medicine or increase your dose of the medicine before your allergy season starts. Other Irritants to Avoid:  Tobacco smoke is an irritant. If you smoke, ask your health care provider how you can quit. Ask family members to quit smoking, too. Do not allow smoking in your home or car.  If possible, do not use a wood-burning stove, kerosene heater, or fireplace. Minimize exposure to all sources of smoke, including incense, candles, fires, and fireworks.  Try to stay away from strong odors and sprays, such as perfume, talcum powder, hair spray, and paints.  Decrease humidity in your home and use an indoor air cleaning device. Reduce indoor humidity to below 60%. Dehumidifiers or central air conditioners can do this.  Decrease house dust exposure by changing furnace and air cooler filters frequently.  Try to have someone else vacuum for you once or twice a week. Stay out of rooms while they are being vacuumed and for a short while afterward.  If you vacuum, use a dust mask from a hardware store, a  double-layered or microfilter vacuum cleaner bag, or a vacuum cleaner with a HEPA filter.  Sulfites in foods and beverages can be irritants. Do not drink beer or wine or eat dried fruit, processed potatoes, or shrimp if they cause asthma symptoms.  Cold air can trigger an asthma attack. Cover your nose and mouth with a scarf on cold or windy days.  Several health conditions can make asthma more difficult to manage, including a runny nose, sinus infections, reflux disease, psychological stress, and sleep apnea. Work with your health care provider to manage these conditions.  Avoid close contact with people who have a respiratory infection such as a cold or the flu, since your asthma symptoms may get worse if you catch the infection. Wash your hands thoroughly after touching items that may have been handled by people with a respiratory infection.  Get a flu shot every year to protect against the flu virus, which often makes asthma worse for days or weeks. Also get a pneumonia shot if you have not previously had one. Unlike the flu shot, the pneumonia shot does not need to be given yearly. Medicines:  Talk to your health care provider about whether it is safe for you to take aspirin or non-steroidal anti-inflammatory medicines (NSAIDs). In a small number of people with asthma, aspirin and NSAIDs can cause asthma attacks. These medicines must be avoided by people who have known aspirin-sensitive asthma. It is important that people with aspirin-sensitive asthma read labels of all over-the-counter medicines used to treat pain, colds, coughs, and fever.  Beta-blockers and ACE inhibitors are other medicines you should discuss with your health care provider. HOW CAN I FIND OUT WHAT I AM ALLERGIC TO? Ask your asthma health care provider about allergy skin testing or blood testing (the RAST test) to identify the allergens to which you are sensitive. If you are found to have allergies, the most important thing  to do is to try to avoid exposure to any allergens that you are sensitive to as much as possible. Other treatments for allergies, such as medicines and allergy shots (immunotherapy) are available.  CAN I EXERCISE? Follow your health care provider's advice regarding asthma treatment before exercising. It is important to maintain a regular exercise program, but vigorous exercise or exercise in cold, humid, or dry environments can cause asthma attacks, especially for those people who have exercise-induced asthma. Document Released: 05/28/2009 Document Revised: 06/14/2013  Document Reviewed: 12/15/2012 [facility] Patient Information 2015 [place], [place]. This information is not intended to replace advice given to you by your health care provider. Make sure you discuss any questions you have with your health care provider.

## 2014-05-24 NOTE — ED Notes (Signed)
Pt reports recent cough and cold symptoms. Cough became productive yesterday and having fever. Now feeling sob. spo2 95% at triage, has been on antibiotics x 3 days.

## 2014-05-24 NOTE — ED Notes (Signed)
Patient ambulated independently. Patient's oxygen was 100%-99% during ambulation.

## 2014-07-24 ENCOUNTER — Emergency Department (HOSPITAL_COMMUNITY): Payer: Medicaid Other

## 2014-07-24 ENCOUNTER — Emergency Department (HOSPITAL_COMMUNITY)
Admission: EM | Admit: 2014-07-24 | Discharge: 2014-07-24 | Disposition: A | Discharge status: Home / Self Care | Payer: Medicaid Other | Attending: Emergency Medicine | Admitting: Emergency Medicine

## 2014-07-24 ENCOUNTER — Encounter (HOSPITAL_COMMUNITY): Payer: Self-pay | Admitting: Emergency Medicine

## 2014-07-24 DIAGNOSIS — M25551 Pain in right hip: Secondary | ICD-10-CM | POA: Insufficient documentation

## 2014-07-24 DIAGNOSIS — Z8719 Personal history of other diseases of the digestive system: Secondary | ICD-10-CM | POA: Diagnosis not present

## 2014-07-24 DIAGNOSIS — S73006A Unspecified dislocation of unspecified hip, initial encounter: Secondary | ICD-10-CM

## 2014-07-24 DIAGNOSIS — F319 Bipolar disorder, unspecified: Secondary | ICD-10-CM | POA: Diagnosis not present

## 2014-07-24 DIAGNOSIS — Z87442 Personal history of urinary calculi: Secondary | ICD-10-CM | POA: Insufficient documentation

## 2014-07-24 DIAGNOSIS — Z8679 Personal history of other diseases of the circulatory system: Secondary | ICD-10-CM | POA: Insufficient documentation

## 2014-07-24 DIAGNOSIS — Z880 Allergy status to penicillin: Secondary | ICD-10-CM | POA: Diagnosis not present

## 2014-07-24 DIAGNOSIS — J45909 Unspecified asthma, uncomplicated: Secondary | ICD-10-CM | POA: Insufficient documentation

## 2014-07-24 DIAGNOSIS — F419 Anxiety disorder, unspecified: Secondary | ICD-10-CM | POA: Insufficient documentation

## 2014-07-24 DIAGNOSIS — Z8639 Personal history of other endocrine, nutritional and metabolic disease: Secondary | ICD-10-CM | POA: Insufficient documentation

## 2014-07-24 DIAGNOSIS — Z79899 Other long term (current) drug therapy: Secondary | ICD-10-CM | POA: Insufficient documentation

## 2014-07-24 DIAGNOSIS — Z720 Tobacco use: Secondary | ICD-10-CM | POA: Diagnosis not present

## 2014-07-24 DIAGNOSIS — M25552 Pain in left hip: Secondary | ICD-10-CM | POA: Diagnosis present

## 2014-07-24 DIAGNOSIS — E039 Hypothyroidism, unspecified: Secondary | ICD-10-CM | POA: Insufficient documentation

## 2014-07-24 MED ORDER — OXYCODONE-ACETAMINOPHEN 5-325 MG PO TABS: 1.0000 | ORAL_TABLET | ORAL | Status: DC | PRN

## 2014-07-24 NOTE — Discharge Instructions (Signed)
Take the prescribed medication as directed. Follow-up with orthopedics-- call to schedule appt. Return to the ED for new or worsening symptoms.

## 2014-07-24 NOTE — ED Notes (Signed)
Pt reports both of her hips are dislocated after a coughing fit. States this occurs regularly and usually she can get them back in own her own but unable to do so this time.

## 2014-07-24 NOTE — ED Provider Notes (Signed)
CSN: [id number]     Arrival date & time 07/24/14  1428 History   First MD Initiated Contact with Patient 07/24/14 1601     Chief Complaint  Patient presents with  . Hip Injury     (Consider location/radiation/quality/duration/timing/severity/associated sxs/prior Treatment) The history is provided by the patient and medical records.    This is a 34 year old female with past medical history significant for bipolar disorder, ADD, hyperlipidemia, anxiety, migraine headaches, presenting to the ED for possible bilateral hip dislocation. Patient states she got into a coughing fit on Saturday and felt that both her hips were dislocated. 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 She denies any numbness or paresthesias of her lower extremities. No loss of bowel or bladder control. She has been able to ambulate but states it is very painful. Patient is not currently established with an orthopedist.  She denies any chest pain, shortness of breath, nausea, vomiting, fever, or chills.  Past Medical History  Diagnosis Date  . Bipolar 1 disorder   . Gastritis   . Asthma   . ADD (attention deficit disorder with hyperactivity)   . High cholesterol   . Kidney stones   . Hypothyroid     post ablation for graves  . Anxiety     general anxiety disorder  . Migraines    Past Surgical History  Procedure Laterality Date  . Partial pancriatectomy    . Spleenectomy    . Tubal ligation    . Spleenectomy    . Exploratory laparotomy    . Cholecystectomy    . Ablation colpoclesis    . Dilitation & currettage/hystroscopy with essure     Family History  Problem Relation Age of Onset  . Hyperlipidemia Mother   . Hyperlipidemia Father   . Cancer Paternal Grandmother     breast   History  Substance Use Topics  . Smoking status: Current Some Day Smoker -- 1.00 packs/day for 10 years   Types: Cigarettes    Start date: 08/26/2001  . Smokeless tobacco: Never Used     Comment: still smoking  . Alcohol Use: No   OB History    Gravida Para Term Preterm AB TAB SAB Ectopic Multiple Living   4 1 1  0 3 0 3 0 0 1      Obstetric Comments   Pt does not have custody of her child due to her mental illness.     Review of Systems  Musculoskeletal: Positive for arthralgias.  All other systems reviewed and are negative.     Allergies  Atorvastatin; Barium sulfate; Ivp dye; Metrizamide; Morphine and related; Penicillins; and Lamictal  Home Medications   Prior to Admission medications   Medication Sig Start Date End Date Taking? Authorizing Provider  albuterol (PROVENTIL HFA;VENTOLIN HFA) 108 (90 BASE) MCG/ACT inhaler Inhale 2 puffs into the lungs every 4 (four) hours as needed for wheezing or shortness of breath.    Historical Provider, MD  amphetamine-dextroamphetamine (ADDERALL XR) 25 MG 24 hr capsule Take 30 mg by mouth every morning.     Historical Provider, MD  amphetamine-dextroamphetamine (ADDERALL) 20 MG tablet Take 20 mg by mouth daily.    Historical Provider, MD  ARIPiprazole (ABILIFY MAINTENA) 400 MG SUSR Inject into the muscle every 30 (thirty) days.    Historical Provider, MD  escitalopram (LEXAPRO) 10 MG tablet Take 10 mg by mouth once a  week.    Historical Provider, MD  hydrOXYzine (VISTARIL) 50 MG capsule Take 50 mg by mouth as needed for anxiety.     Historical Provider, MD  levothyroxine (SYNTHROID) 150 MCG tablet Take 125 mcg by mouth daily before breakfast.     Historical Provider, MD  tiotropium (SPIRIVA) 18 MCG inhalation capsule Place 18 mcg into inhaler and inhale daily.    Historical Provider, MD  traMADol (ULTRAM) 50 MG tablet Take 1 tablet (50 mg total) by mouth every 6 (six) hours as needed. 01/24/14   Antonietta Breach, PA-C   BP 109/75 mmHg  Pulse 102  Temp(Src) 98.1 F (36.7 C) (Oral)  Resp 18  SpO2 100%   Physical Exam  Constitutional: She is  oriented to person, place, and time. She appears well-developed and well-nourished. No distress.  HENT:  Head: Normocephalic and atraumatic.  Mouth/Throat: Oropharynx is clear and moist.  Eyes: Conjunctivae and EOM are normal. Pupils are equal, round, and reactive to light.  Neck: Normal range of motion. Neck supple.  Cardiovascular: Normal rate, regular rhythm and normal heart sounds.   Pulmonary/Chest: Effort normal and breath sounds normal. No respiratory distress. She has no wheezes.  Abdominal: Soft. Bowel sounds are normal. There is no tenderness. There is no guarding.  Musculoskeletal: Normal range of motion. She exhibits no edema.  Full ROM of bilateral hips, some pain noted to right hip with full flexion; no gross deformities, pelvis stable; both legs remains NVI; no tenderness or deformities or LS; ambulating with slow but steady gait  Neurological: She is alert and oriented to person, place, and time.  Skin: Skin is warm and dry. She is not diaphoretic.  Psychiatric: She has a normal mood and affect.  Nursing note and vitals reviewed.   ED Course  Procedures (including critical care time) Labs Review Labs Reviewed - No data to display  Imaging Review Dg Hips Bilat With Pelvis 2v  07/24/2014   CLINICAL DATA:  Hip popped out of joint while standing. Pain of both hips. Initial encounter.  EXAM: BILATERAL HIP (WITH PELVIS) 2 VIEWS  COMPARISON:  None.  FINDINGS: The hips are located and intact. There is no degenerative change or evidence of osteonecrosis. Developmental appearing deviation of the coccyx to the left. The pelvic ring is intact. 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  IMPRESSION: No acute osseous findings.   Electronically Signed   By: Jorje Guild M.D.   On: 07/24/2014 15:40     EKG Interpretation None      MDM   Final diagnoses:  Bilateral hip pain   34 year old  female with complaint of possible bilateral hip dislocation, she has history of same. Patient has been able to ambulate but states it is somewhat painful. She denies any numbness or paresthesias of her lower extremities. No loss of bowel or bladder control. Her neurologic exam is nonfocal. I personally ambulated patient in the room, she has a slow but steady gait. She has full range of motion of bilateral hips without noted deformities, pelvis is stable. Imaging negative for acute dislocation or fracture. Patient will be discharged home with pain medicine, she was encouraged to follow with orthopedics.  Discussed plan with patient, he/she acknowledged understanding and agreed with plan of care.  Return precautions given for new or worsening symptoms.  Larene Pickett, PA-C 07/24/14 1901  Ernestina Patches, MD 07/25/14 [phone]

## 2014-08-10 ENCOUNTER — Telehealth: Payer: Self-pay | Admitting: Hematology & Oncology

## 2014-08-10 NOTE — Telephone Encounter (Signed)
Faxed medical records to:  [facility] [address] [place], [place] [address]  F: [phone] P: [phone]   Last 2 ov notes     COPY SCANNED

## 2014-10-20 ENCOUNTER — Emergency Department (HOSPITAL_COMMUNITY)
Admission: EM | Admit: 2014-10-20 | Discharge: 2014-10-20 | Disposition: A | Discharge status: Home / Self Care | Payer: Medicaid Other | Attending: Emergency Medicine | Admitting: Emergency Medicine

## 2014-10-20 ENCOUNTER — Emergency Department (HOSPITAL_COMMUNITY): Payer: Medicaid Other

## 2014-10-20 ENCOUNTER — Encounter (HOSPITAL_COMMUNITY): Payer: Self-pay

## 2014-10-20 DIAGNOSIS — Z79899 Other long term (current) drug therapy: Secondary | ICD-10-CM | POA: Insufficient documentation

## 2014-10-20 DIAGNOSIS — F419 Anxiety disorder, unspecified: Secondary | ICD-10-CM | POA: Diagnosis not present

## 2014-10-20 DIAGNOSIS — F909 Attention-deficit hyperactivity disorder, unspecified type: Secondary | ICD-10-CM | POA: Diagnosis not present

## 2014-10-20 DIAGNOSIS — M79601 Pain in right arm: Secondary | ICD-10-CM | POA: Diagnosis present

## 2014-10-20 DIAGNOSIS — Z8679 Personal history of other diseases of the circulatory system: Secondary | ICD-10-CM | POA: Diagnosis not present

## 2014-10-20 DIAGNOSIS — R110 Nausea: Secondary | ICD-10-CM | POA: Insufficient documentation

## 2014-10-20 DIAGNOSIS — J45909 Unspecified asthma, uncomplicated: Secondary | ICD-10-CM | POA: Insufficient documentation

## 2014-10-20 DIAGNOSIS — Z8719 Personal history of other diseases of the digestive system: Secondary | ICD-10-CM | POA: Insufficient documentation

## 2014-10-20 DIAGNOSIS — E039 Hypothyroidism, unspecified: Secondary | ICD-10-CM | POA: Insufficient documentation

## 2014-10-20 DIAGNOSIS — R079 Chest pain, unspecified: Secondary | ICD-10-CM | POA: Diagnosis not present

## 2014-10-20 DIAGNOSIS — Z720 Tobacco use: Secondary | ICD-10-CM | POA: Diagnosis not present

## 2014-10-20 DIAGNOSIS — Z87442 Personal history of urinary calculi: Secondary | ICD-10-CM | POA: Insufficient documentation

## 2014-10-20 DIAGNOSIS — R61 Generalized hyperhidrosis: Secondary | ICD-10-CM | POA: Diagnosis not present

## 2014-10-20 DIAGNOSIS — F319 Bipolar disorder, unspecified: Secondary | ICD-10-CM | POA: Insufficient documentation

## 2014-10-20 DIAGNOSIS — Z880 Allergy status to penicillin: Secondary | ICD-10-CM | POA: Diagnosis not present

## 2014-10-20 LAB — BASIC METABOLIC PANEL
Anion gap: 7 (ref 5–15)
BUN: <5 mg/dL — ABNORMAL LOW (ref 6–23)
CO2: 27 mmol/L (ref 19–32)
CREATININE: 0.77 mg/dL (ref 0.50–1.10)
Calcium: 9.3 mg/dL (ref 8.4–10.5)
Chloride: 105 mmol/L (ref 96–112)
GFR calc Af Amer: >90 mL/min (ref >90)
GFR calc non Af Amer: >90 mL/min (ref >90)
GLUCOSE: 113 mg/dL — AB (ref 70–99)
POTASSIUM: 3.6 mmol/L (ref 3.5–5.1)
Sodium: 139 mmol/L (ref 135–145)

## 2014-10-20 LAB — CBC
HCT: 37.3 % (ref 36.0–46.0)
HEMOGLOBIN: 12.6 g/dL (ref 12.0–15.0)
MCH: 31.3 pg (ref 26.0–34.0)
MCHC: 33.8 g/dL (ref 30.0–36.0)
MCV: 92.8 fL (ref 78.0–100.0)
PLATELETS: 480 10*3/uL — AB (ref 150–400)
RBC: 4.02 MIL/uL (ref 3.87–5.11)
RDW: 13.3 % (ref 11.5–15.5)
WBC: 13.7 10*3/uL — AB (ref 4.0–10.5)

## 2014-10-20 LAB — I-STAT TROPONIN, ED
TROPONIN I, POC: 0 ng/mL (ref 0.00–0.08)
Troponin i, poc: 0 ng/mL (ref 0.00–0.08)
Troponin i, poc: 0 ng/mL (ref 0.00–0.08)

## 2014-10-20 NOTE — ED Provider Notes (Signed)
CSN: [id number]     Arrival date & time 10/20/14  1640 History   First MD Initiated Contact with Patient 10/20/14 1827     Chief Complaint  Patient presents with  . Hypertension   HPI   34 year old female presents today with right arm pain. Patient reports that she was walking to work and felt "achy" sensation in her right arm. Patient reports associated chest tightness, diaphoresis. She reports that the symptoms aren't resolved she experienced nausea but no vomiting. She reports global sensation of "feeling funny". She reports that she had a blood pressure taken with the reading in the 140s, which she reports is high for her. She reports she's never had anything like this happen to her before. She reports significant past medical history of abdominal surgeries, but denies any abdominal pain during the episode. Patient denies any trauma to the arm, or recent history of heavy lifting. Patient reports she is smoker, denies any significant past cardiac pulmonary history, with the exception of asthma for which she uses albuterol. Patient reports that symptoms lasted approximately an hour, is not having any symptoms at time of evaluation. Patient denies use of alcohol or drugs, significant amounts of caffeine. She does take Adderall.  Past Medical History  Diagnosis Date  . Bipolar 1 disorder   . Gastritis   . Asthma   . ADD (attention deficit disorder with hyperactivity)   . High cholesterol   . Kidney stones   . Hypothyroid     post ablation for graves  . Anxiety     general anxiety disorder  . Migraines    Past Surgical History  Procedure Laterality Date  . Partial pancriatectomy    . Spleenectomy    . Tubal ligation    . Spleenectomy    . Exploratory laparotomy    . Cholecystectomy    . Ablation colpoclesis    . Dilitation & currettage/hystroscopy with essure     Family History  Problem Relation Age of Onset  . Hyperlipidemia Mother   . Hyperlipidemia Father   . Cancer Paternal  Grandmother     breast   History  Substance Use Topics  . Smoking status: Current Some Day Smoker -- 1.00 packs/day for 10 years    Types: Cigarettes    Start date: 08/26/2001  . Smokeless tobacco: Never Used     Comment: still smoking  . Alcohol Use: No   OB History    Gravida Para Term Preterm AB TAB SAB Ectopic Multiple Living   4 1 1  0 3 0 3 0 0 1      Obstetric Comments   Pt does not have custody of her child due to her mental illness.     Review of Systems  All other systems reviewed and are negative.   Allergies  Atorvastatin; Barium sulfate; Ivp dye; Metrizamide; Morphine and related; Penicillins; and Lamictal  Home Medications   Prior to Admission medications   Medication Sig Start Date End Date Taking? Authorizing Provider  albuterol (PROVENTIL HFA;VENTOLIN HFA) 108 (90 BASE) MCG/ACT inhaler Inhale 2 puffs into the lungs every 4 (four) hours as needed for wheezing or shortness of breath.    Historical Provider, MD  amphetamine-dextroamphetamine (ADDERALL XR) 25 MG 24 hr capsule Take 30 mg by mouth every morning.     Historical Provider, MD  amphetamine-dextroamphetamine (ADDERALL) 20 MG tablet Take 20 mg by mouth daily.    Historical Provider, MD  ARIPiprazole (ABILIFY MAINTENA) 400 MG SUSR Inject into the  muscle every 30 (thirty) days.    Historical Provider, MD  escitalopram (LEXAPRO) 10 MG tablet Take 10 mg by mouth once a week.    Historical Provider, MD  hydrOXYzine (VISTARIL) 50 MG capsule Take 50 mg by mouth as needed for anxiety.     Historical Provider, MD  levothyroxine (SYNTHROID) 150 MCG tablet Take 125 mcg by mouth daily before breakfast.     Historical Provider, MD  oxyCODONE-acetaminophen (PERCOCET/ROXICET) 5-325 MG per tablet Take 1 tablet by mouth every 4 (four) hours as needed. 07/24/14   Larene Pickett, PA-C  tiotropium (SPIRIVA) 18 MCG inhalation capsule Place 18 mcg into inhaler and inhale daily.    Historical Provider, MD  traMADol (ULTRAM) 50 MG  tablet Take 1 tablet (50 mg total) by mouth every 6 (six) hours as needed. 01/24/14   Antonietta Breach, PA-C   BP 132/82 mmHg  Pulse 98  Temp(Src) 98.4 F (36.9 C) (Oral)  Resp 18  Ht 5' 0.25" (1.53 m)  Wt 190 lb (86.183 kg)  BMI 36.82 kg/m2  SpO2 100% Physical Exam  Constitutional: She is oriented to person, place, and time. She appears well-developed and well-nourished.  HENT:  Head: Normocephalic and atraumatic.  Eyes: Pupils are equal, round, and reactive to light.  Neck: Normal range of motion. Neck supple. No JVD present. No tracheal deviation present. No thyromegaly present.  Cardiovascular: Regular rhythm, normal heart sounds and intact distal pulses.  Exam reveals no gallop and no friction rub.   No murmur heard. Pulmonary/Chest: Effort normal and breath sounds normal. No stridor. No respiratory distress. She has no wheezes. She has no rales. She exhibits no tenderness.  Musculoskeletal: Normal range of motion.  Lymphadenopathy:    She has no cervical adenopathy.  Neurological: She is alert and oriented to person, place, and time. Coordination normal.  Skin: Skin is warm and dry.  Psychiatric: She has a normal mood and affect. Her behavior is normal. Judgment and thought content normal.  Nursing note and vitals reviewed.   ED Course  Procedures (including critical care time) Labs Review Labs Reviewed  CBC - Abnormal; Notable for the following:    WBC 13.7 (*)    Platelets 480 (*)    All other components within normal limits  BASIC METABOLIC PANEL - Abnormal; Notable for the following:    Glucose, Bld 113 (*)    BUN <5 (*)    All other components within normal limits  I-STAT TROPOININ, ED  Randolm Idol, ED    Imaging Review Dg Chest 2 View  10/20/2014   CLINICAL DATA:  Shortness of breath with chest and right arm pain for 4 hours. History of asthma. Smoker. Initial encounter.  EXAM: CHEST  2 VIEW  COMPARISON:  Radiographs 05/24/2014.  Abdominal CT 03/04/2012.   FINDINGS: The heart size and mediastinal contours are normal. The lungs are clear. There is no pleural effusion or pneumothorax. No acute osseous findings are identified. There is a mild convex right thoracic scoliosis. Postsurgical changes are present within the upper abdomen. Curvilinear calcification anteriorly in the upper abdomen is stable from prior abdominal CT.  IMPRESSION: No active cardiopulmonary process.   Electronically Signed   By: Richardean Sale M.D.   On: 10/20/2014 18:56   Normal sinus rhythm no ST elevation normal EKG Vent. rate 88 BPM PR interval 118 ms QRS duration 82 ms QT/QTc 372/450 ms P-R-T axes 54 74 40      MDM   Final diagnoses:  Pain of  right upper extremity   Labs: I-STAT troponin x 2 , CBC- no significant findings  Imaging: DG chest- no acute findings  Consults: None  Therapeutics: None  Assessment: Arm pain  Plan: Patient's presentation unlikely cardio/pulmonary in nature. She does not have a significant past medical history, presentation not typical for cardiac, and had normal ekg, troponins x2, and resolution of symptoms. She denies respiratory complaints, no history of cough, fever, recent surgery, hypoxemia, hemoptysis, malignancy. Pt is instructed to monitor for new or worsening signs or symptoms, she was given strict return instructions in the event that symptoms return or new symptoms presented. Patient remained asymptomatic throughout her stay. Patient understood and agreed to the plan today, ensure her follow-up evaluation if necessary. Patient was instructed contact her primary care provider, informed him of her visit today and share all relevant information.      Okey Regal, PA-C 10/21/14 0234  Dorie Rank, MD 10/21/14 [phone]

## 2014-10-20 NOTE — ED Notes (Signed)
Pt in xray, will take there after xray.

## 2014-10-20 NOTE — ED Notes (Signed)
Pt.  Reports having rt. Arm pain that began around 15:30 radiates into her neck.  She is also having chest tightness, Also reports feeling cold but also hot.  She is having nausea but denies any vomiting or diarrhea. She reports that her head feels funny,  She stated, "Like a jello mold"   GCS 15.  Skin is p/w/d, No neuro deficits noted.

## 2014-10-20 NOTE — Discharge Instructions (Signed)
Chest Pain (Nonspecific) °It is often hard to give a specific diagnosis for the cause of chest pain. There is always a chance that your pain could be related to something serious, such as a heart attack or a blood clot in the lungs. You need to follow up with your health care provider for further evaluation. °CAUSES  °· Heartburn. °· Pneumonia or bronchitis. °· Anxiety or stress. °· Inflammation around your heart (pericarditis) or lung (pleuritis or pleurisy). °· A blood clot in the lung. °· A collapsed lung (pneumothorax). It can develop suddenly on its own (spontaneous pneumothorax) or from trauma to the chest. °· Shingles infection (herpes zoster virus). °The chest wall is composed of bones, muscles, and cartilage. Any of these can be the source of the pain. °· The bones can be bruised by injury. °· The muscles or cartilage can be strained by coughing or overwork. °· The cartilage can be affected by inflammation and become sore (costochondritis). °DIAGNOSIS  °Lab tests or other studies may be needed to find the cause of your pain. Your health care provider may have you take a test called an ambulatory electrocardiogram (ECG). An ECG records your heartbeat patterns over a 24-hour period. You may also have other tests, such as: °· Transthoracic echocardiogram (TTE). During echocardiography, sound waves are used to evaluate how blood flows through your heart. °· Transesophageal echocardiogram (TEE). °· Cardiac monitoring. This allows your health care provider to monitor your heart rate and rhythm in real time. °· Holter monitor. This is a portable device that records your heartbeat and can help diagnose heart arrhythmias. It allows your health care provider to track your heart activity for several days, if needed. °· Stress tests by exercise or by giving medicine that makes the heart beat faster. °TREATMENT  °· Treatment depends on what may be causing your chest pain. Treatment may include: °¨ Acid blockers for  heartburn. °¨ Anti-inflammatory medicine. °¨ Pain medicine for inflammatory conditions. °¨ Antibiotics if an infection is present. °· You may be advised to change lifestyle habits. This includes stopping smoking and avoiding alcohol, caffeine, and chocolate. °· You may be advised to keep your head raised (elevated) when sleeping. This reduces the chance of acid going backward from your stomach into your esophagus. °Most of the time, nonspecific chest pain will improve within 2-3 days with rest and mild pain medicine.  °HOME CARE INSTRUCTIONS  °· If antibiotics were prescribed, take them as directed. Finish them even if you start to feel better. °· For the next few days, avoid physical activities that bring on chest pain. Continue physical activities as directed. °· Do not use any tobacco products, including cigarettes, chewing tobacco, or electronic cigarettes. °· Avoid drinking alcohol. °· Only take medicine as directed by your health care provider. °· Follow your health care provider's suggestions for further testing if your chest pain does not go away. °· Keep any follow-up appointments you made. If you do not go to an appointment, you could develop lasting (chronic) problems with pain. If there is any problem keeping an appointment, call to reschedule. °SEEK MEDICAL CARE IF:  °· Your chest pain does not go away, even after treatment. °· You have a rash with blisters on your chest. °· You have a fever. °SEEK IMMEDIATE MEDICAL CARE IF:  °· You have increased chest pain or pain that spreads to your arm, neck, jaw, back, or abdomen. °· You have shortness of breath. °· You have an increasing cough, or you cough   up blood.  You have severe back or abdominal pain.  You feel nauseous or vomit.  You have severe weakness.  You faint.  You have chills. This is an emergency. Do not wait to see if the pain will go away. Get medical help at once. Call your local emergency services (911 in [place] Do not drive  yourself to the hospital. MAKE SURE YOU:   Understand these instructions.  Will watch your condition.  Will get help right away if you are not doing well or get worse. Document Released: 03/19/2005 Document Revised: 06/14/2013 Document Reviewed: 01/13/2008 [facility] Patient Information 2015 [place], [place]. This information is not intended to replace advice given to you by your health care provider. Make sure you discuss any questions you have with your health care provider.  Please read th above information in the event the arm pain or chest pain present. Please monitor for worsening signs or symptoms and follow-up immediately if they present. Please contact your PCP and inform them of your visit today.

## 2017-01-06 ENCOUNTER — Encounter: Payer: Self-pay | Admitting: Neurology

## 2017-01-06 ENCOUNTER — Ambulatory Visit (INDEPENDENT_AMBULATORY_CARE_PROVIDER_SITE_OTHER): Payer: PRIVATE HEALTH INSURANCE | Admitting: Neurology

## 2017-01-06 VITALS — BP 118/81 | HR 103 | Resp 5 | Ht 61.0 in | Wt 165.0 lb

## 2017-01-06 DIAGNOSIS — Z5320 Procedure and treatment not carried out because of patient's decision for unspecified reasons: Secondary | ICD-10-CM

## 2017-01-06 DIAGNOSIS — Z9119 Patient's noncompliance with other medical treatment and regimen: Secondary | ICD-10-CM

## 2017-01-06 NOTE — Patient Instructions (Signed)
No AVS given, patient left.

## 2017-01-06 NOTE — Progress Notes (Signed)
Subjective:    Patient ID: Christy Obrien is a 36 y.o. female.  HPI   Dear Christy Obrien,  I saw your patient, Christy Obrien, upon your kind request in my neurologic clinic today for initial consultation of her sleep disorder, in particular, evaluation of her OSA. The patient is unaccompanied today. As you know, Ms. Christy Obrien is a 36 year old right-handed woman with an underlying medical history of anxiety, mood disorder (bipolar I per patient), hyperlipidemia, hypothyroidism, prior smoking, history of PTSD, vitamin D deficiency and obesity, who reports extended sleep of even over 20 hours when not hypomanic and difficulty waking up in the morning. She was previously diagnosed with obstructive sleep apnea. She had [facility] on 10/17/2014 and I reviewed the results, total AHI was 7.6 per hour, O2 nadir was 86%. She was noted to have moderate snoring and mild limb movements, PLM index was 8.1 per hour. The study was interpreted by Dr. Tonia Ghent.  She feels sleepy during the day, ESS 15/24. She lives with her 34 yo son. She has difficulty waking up in the morning. She has difficulty sleeping when she is hypomanic. She currently reports being in a hypomanic state. Of note, patient reports that her mild sleep apnea does not need to be treated. She does not wish to come in for a sleep study. She wants to have a home sleep test. She was explained that she already had a valid appearing diagnostic sleep study a little over 2 years ago. She states that her previous neurologist told her that she did not need to be treated for her mild sleep apnea. Nevertheless, I tried to explain to the patient that she may benefit from treatment of even mild sleep apnea as it may help with her daytime symptoms including daytime somnolence and difficulty waking up in the mornings. I told her that the degree of OSA does not have to correlate with the degree of daytime  sleepiness. Also tried to explain to her that her sleep disturbance may in part be secondary to her mood disorder and also in part be secondary to potentially sedating medications. She became irritated and louder and started yelling at me, telling me to "stop, stop! You are irritating me, you are not listening to me". She reported that no one listens to her and no one wants to help her. She was given several options including rescheduling this appointment, going back to her previous neurologist, waiting a little bit and I could come back in when she is calmer. She stated, that her previous neurologist also did not listen to her and wanted to start her on an antidepressant. She was offered the possibility of scheduling a CPAP titration at a later time at her convenience, with the idea that should she still be suffering from daytime somnolence after being on CPAP for her mild obstructive sleep apnea we could look into additional sleep study testing including repeat nighttime sleep study testing with daytime nap testing next day. The patient was advised, however, that she would have to be off of her psychotropic medications in order to have valid test results. She stated that she could never come off of her Abilify and that she is not currently on Lexapro - only when she has depression will she be on Lexapro. I did not have a chance to examine the patient as she started yelling and I offered that I would leave the room and come back but the patient  got up and left without a physical exam and without a plan.   Review of Systems  Neurological:       Pt presents today to discuss her sleep. Pt says that when she is not hypo-manic (pt is currently in a hypo-manic state) she can sleep 20+ hours per day if someone does not wake her up. While she is hypo-manic, pt says that she has trouble sleeping. Pt is asking for a sleep study at home because she says that an in-lab study will not show what is really going  on.  Epworth Sleepiness Scale 0= would never doze 1= slight chance of dozing 2= moderate chance of dozing 3= high chance of dozing  Sitting and reading: 2 Watching TV: 3 Sitting inactive in a public place (ex. Theater or meeting): 2 As a passenger in a car for an hour without a break: 1 Lying down to rest in the afternoon: 3 Sitting and talking to someone: 1 Sitting quietly after lunch (no alcohol): 2 In a car, while stopped in traffic: 1 Total: 15     Objective:  Neurological Exam  Physical Exam BP 118/81   Pulse (!) 103   Resp (!) 5   Ht 5\' 1"  (1.549 m)   Wt 165 lb (74.8 kg)   BMI 31.18 kg/m  No exam done. Patient got up and left.  Assessment and Plan:  Please see note above, I spent about 12 minutes with the patient face to face for above discussion.  Star Age, MD, PhD [facility])

## 2017-02-02 ENCOUNTER — Institutional Professional Consult (permissible substitution): Payer: Self-pay | Admitting: Neurology

## 2017-03-09 ENCOUNTER — Telehealth: Payer: Self-pay

## 2017-03-09 NOTE — Telephone Encounter (Signed)
Patient called and was complaining about her bill. She wanted a Freight forwarder to call her back. I called and spoke with patient. She complained she did not get service or anything done. Documentation is in the chart from her visit. I told her we could not write off the $105 charge.  Dr.Athar billed it at the lowest service. Dr. Rexene Alberts offered her to [facility] appointment but she refused.  She said to document she would not pay bill. We could turn it over to a collection agency. She was very unhappy with service.

## 2017-06-08 ENCOUNTER — Emergency Department (HOSPITAL_COMMUNITY)
Admission: EM | Admit: 2017-06-08 | Discharge: 2017-06-08 | Disposition: A | Discharge status: Home / Self Care | Payer: Medicaid Other | Attending: Emergency Medicine | Admitting: Emergency Medicine

## 2017-06-08 ENCOUNTER — Other Ambulatory Visit: Payer: Self-pay

## 2017-06-08 ENCOUNTER — Emergency Department (HOSPITAL_COMMUNITY): Payer: Medicaid Other

## 2017-06-08 ENCOUNTER — Encounter (HOSPITAL_COMMUNITY): Payer: Self-pay | Admitting: Emergency Medicine

## 2017-06-08 DIAGNOSIS — R3915 Urgency of urination: Secondary | ICD-10-CM | POA: Diagnosis not present

## 2017-06-08 DIAGNOSIS — J45909 Unspecified asthma, uncomplicated: Secondary | ICD-10-CM | POA: Insufficient documentation

## 2017-06-08 DIAGNOSIS — F17210 Nicotine dependence, cigarettes, uncomplicated: Secondary | ICD-10-CM | POA: Insufficient documentation

## 2017-06-08 DIAGNOSIS — J069 Acute upper respiratory infection, unspecified: Secondary | ICD-10-CM | POA: Diagnosis not present

## 2017-06-08 DIAGNOSIS — Z79899 Other long term (current) drug therapy: Secondary | ICD-10-CM | POA: Diagnosis not present

## 2017-06-08 DIAGNOSIS — J029 Acute pharyngitis, unspecified: Secondary | ICD-10-CM | POA: Diagnosis present

## 2017-06-08 DIAGNOSIS — E039 Hypothyroidism, unspecified: Secondary | ICD-10-CM | POA: Insufficient documentation

## 2017-06-08 LAB — URINALYSIS, ROUTINE W REFLEX MICROSCOPIC
BILIRUBIN URINE: NEGATIVE
Glucose, UA: NEGATIVE mg/dL
HGB URINE DIPSTICK: NEGATIVE
KETONES UR: NEGATIVE mg/dL
Leukocytes, UA: NEGATIVE
NITRITE: NEGATIVE
Protein, ur: NEGATIVE mg/dL
Specific Gravity, Urine: 1.023 (ref 1.005–1.030)
pH: 7 (ref 5.0–8.0)

## 2017-06-08 LAB — WET PREP, GENITAL
Clue Cells Wet Prep HPF POC: NONE SEEN
Sperm: NONE SEEN
TRICH WET PREP: NONE SEEN
YEAST WET PREP: NONE SEEN

## 2017-06-08 LAB — RAPID STREP SCREEN (MED CTR MEBANE ONLY): Streptococcus, Group A Screen (Direct): NEGATIVE

## 2017-06-08 NOTE — ED Provider Notes (Signed)
[facility] EMERGENCY DEPARTMENT Provider Note   CSN: [id number] Arrival date & time: 06/08/17  1141     History   Chief Complaint Chief Complaint  Patient presents with  . Sore Throat  . Urinary Tract Infection    HPI Christy Obrien is a 36 y.o. female.  Patient is a 36 year old female who presents with sore throat and possible UTI.  She states she has a one-week history of runny nose congestion, sinus drainage and sore throat.  She states she has had some low-grade fevers but has not checked her temperature because she does not have a thermometer at home.  She denies any nausea or vomiting.  She has a cough which is productive of yellow clear mucus.  No chest pain or shortness of breath.  She does have a feeling that she might have a urinary tract infection which normally presents with urinary urgency and smell.  She reports a odor to her urine.  She denies any nausea vomiting.  No abdominal pain.  She is sexually active with her husband.  She denies any vaginal discharge.      Past Medical History:  Diagnosis Date  . ADD (attention deficit disorder with hyperactivity)   . Anxiety    general anxiety disorder  . Asthma   . Bipolar 1 disorder ([place])   . Gastritis   . High cholesterol   . Hypothyroid    post ablation for graves  . Kidney stones   . Migraines     Patient Active Problem List   Diagnosis Date Noted  . Genital warts 10/04/2012  . White vaginal discharge 09/06/2012  . Cough 04/07/2011  . Bronchitis, acute 03/06/2011  . Migraines 12/16/2010  . Bipolar 2 disorder ([place]) 12/16/2010  . Hypothyroid 12/16/2010  . Hyperlipidemia 12/16/2010  . ADD (attention deficit disorder) 12/16/2010  . Obesity 12/16/2010    Past Surgical History:  Procedure Laterality Date  . ABLATION COLPOCLESIS    . CHOLECYSTECTOMY    . DILITATION & CURRETTAGE/HYSTROSCOPY WITH ESSURE    . EXPLORATORY LAPAROTOMY    . partial pancriatectomy    . spleenectomy      . spleenectomy    . TUBAL LIGATION      OB History    Gravida Para Term Preterm AB Living   4 1 1  0 3 1   SAB TAB Ectopic Multiple Live Births   3 0 0 0        Obstetric Comments   Pt does not have custody of her child due to her mental illness.       Home Medications    Prior to Admission medications   Medication Sig Start Date End Date Taking? Authorizing Provider  albuterol (PROVENTIL HFA;VENTOLIN HFA) 108 (90 BASE) MCG/ACT inhaler Inhale 2 puffs into the lungs every 4 (four) hours as needed for wheezing or shortness of breath.   Yes [provider]  ARIPiprazole ER (ABILIFY MAINTENA) 400 MG PRSY Inject 400 mg into the muscle every 30 (thirty) days. 12/19/13  Yes [provider]  dextroamphetamine (DEXEDRINE SPANSULE) 15 MG 24 hr capsule Take 45 mg by mouth daily. 04/24/16  Yes [provider]  DM-Doxylamine-Acetaminophen (NYQUIL HBP COLD & FLU) 15-6.25-325 MG/15ML LIQD Take 30 mLs by mouth at bedtime as needed (pain).   Yes [provider]  levothyroxine (SYNTHROID, LEVOTHROID) 125 MCG tablet Take 125 mcg by mouth daily before breakfast.   Yes [provider]  topiramate (TOPAMAX) 25 MG tablet Take  50 mg by mouth at bedtime as needed (mania).  01/02/17  Yes [provider]    Family History Family History  Problem Relation Age of Onset  . Hyperlipidemia Mother   . Hyperlipidemia Father   . Cancer Paternal Grandmother        breast    Social History Social History   Tobacco Use  . Smoking status: Current Some Day Smoker    Packs/day: 1.00    Years: 10.00    Pack years: 10.00    Types: Cigarettes    Start date: 08/26/2001  . Smokeless tobacco: Never Used  . Tobacco comment: still smoking  Substance Use Topics  . Alcohol use: No  . Drug use: Yes    Types: Marijuana     Allergies   Doxycycline; Atorvastatin; Barium sulfate; Ivp dye [iodinated diagnostic agents]; Metrizamide; Morphine and related; Other;  Penicillins; Tizanidine; and Lamictal [lamotrigine]   Review of Systems Review of Systems  Constitutional: Positive for fatigue and fever. Negative for chills and diaphoresis.  HENT: Positive for congestion, rhinorrhea, sneezing and sore throat.   Eyes: Negative.   Respiratory: Negative for cough, chest tightness and shortness of breath.   Cardiovascular: Negative for chest pain and leg swelling.  Gastrointestinal: Negative for abdominal pain, blood in stool, diarrhea, nausea and vomiting.  Genitourinary: Positive for urgency. Negative for difficulty urinating, flank pain, frequency and hematuria.  Musculoskeletal: Negative for arthralgias and back pain.  Skin: Negative for rash.  Neurological: Negative for dizziness, speech difficulty, weakness, numbness and headaches.     Physical Exam Updated Vital Signs BP 125/81 (BP Location: Right Arm)   Pulse 94   Temp 98.2 F (36.8 C) (Oral)   Resp 15   Ht 5\' 1"  (1.549 m)   Wt 77.1 kg (170 lb)   SpO2 100%   BMI 32.12 kg/m   Physical Exam  Constitutional: She is oriented to person, place, and time. She appears well-developed and well-nourished.  HENT:  Head: Normocephalic and atraumatic.  Right Ear: Tympanic membrane normal.  Left Ear: Tympanic membrane normal.  Mouth/Throat: Uvula is midline and oropharynx is clear and moist. No uvula swelling. No oropharyngeal exudate, posterior oropharyngeal edema or tonsillar abscesses.  No trismus  Eyes: Pupils are equal, round, and reactive to light.  Neck: Normal range of motion. Neck supple.  Cardiovascular: Normal rate, regular rhythm and normal heart sounds.  Pulmonary/Chest: Effort normal and breath sounds normal. No respiratory distress. She has no wheezes. She has no rales. She exhibits no tenderness.  Abdominal: Soft. Bowel sounds are normal. There is no tenderness. There is no rebound and no guarding.  Genitourinary:  Genitourinary Comments: There is a small amount of yellow brown  discharge, no cervical motion tenderness, no adnexal tenderness  Musculoskeletal: Normal range of motion. She exhibits no edema.  Lymphadenopathy:    She has no cervical adenopathy.  Neurological: She is alert and oriented to person, place, and time.  Skin: Skin is warm and dry. No rash noted.  Psychiatric: She has a normal mood and affect.     ED Treatments / Results  Labs (all labs ordered are listed, but only abnormal results are displayed) Labs Reviewed  WET PREP, GENITAL - Abnormal; Notable for the following components:      Result Value   WBC, Wet Prep HPF POC MANY (*)    All other components within normal limits  URINALYSIS, ROUTINE W REFLEX MICROSCOPIC - Abnormal; Notable for the following components:   Color, Urine AMBER (*)  APPearance HAZY (*)    All other components within normal limits  RAPID STREP SCREEN (NOT AT [facility])  CULTURE, GROUP A STREP ([place])  RPR  HIV ANTIBODY (ROUTINE TESTING)  GC/CHLAMYDIA PROBE AMP ( [place]) NOT AT [facility]    EKG  EKG Interpretation None       Radiology Dg Chest 2 View  Result Date: 06/08/2017 CLINICAL DATA:  Cough with shortness of breath. EXAM: CHEST  2 VIEW COMPARISON:  06/04/2016 FINDINGS: The lungs are clear without focal pneumonia, edema, pneumothorax or pleural effusion. The cardiopericardial silhouette is within normal limits for size. The visualized bony structures of the thorax are intact. IMPRESSION: Normal exam. Electronically Signed   By: Misty Stanley M.D.   On: 06/08/2017 12:40    Procedures Procedures (including critical care time)  Medications Ordered in ED Medications - No data to display   Initial Impression / Assessment and Plan / ED Course  I have reviewed the triage vital signs and the nursing notes.  Pertinent labs & imaging results that were available during my care of the patient were reviewed by me and considered in my medical decision making (see chart for details).     Patient is a  36 year old female who presents with URI symptoms.  Her rapid strep is negative.  Her chest x-ray is clear without evidence of pneumonia.  She also has some subtle UTI symptoms but her urinalysis does not indicate an infection.  Pelvic exam was performed even though she denies vaginal discharge, given her urinary symptoms.  There was a small amount of discharge but no tenderness which would be more concerning for torsion or tubo-ovarian abscess.  Wet prep was negative.  GC and chlamydia is pending.  She was discharged home in good condition.  She was given symptomatic care instructions for her URI.  She was encouraged to follow-up with her PCP.  Return precautions were given.  Final Clinical Impressions(s) / ED Diagnoses   Final diagnoses:  Viral upper respiratory tract infection    ED Discharge Orders    None       Malvin Johns, MD 06/08/17 [phone]

## 2017-06-08 NOTE — ED Triage Notes (Signed)
  Pt states she has a sore throat, feels exhausted, coughing/sneezing. Pt also feels like she has a UTI- complains of foul odor. Denies burning with urination.

## 2017-06-08 NOTE — ED Notes (Signed)
Patient called x3 with no response °

## 2017-06-09 LAB — GC/CHLAMYDIA PROBE AMP (~~LOC~~) NOT AT ARMC
CHLAMYDIA, DNA PROBE: NEGATIVE
Neisseria Gonorrhea: NEGATIVE

## 2017-06-09 LAB — HIV ANTIBODY (ROUTINE TESTING W REFLEX): HIV Screen 4th Generation wRfx: NONREACTIVE

## 2017-06-09 LAB — RPR: RPR Ser Ql: NONREACTIVE

## 2017-06-10 LAB — CULTURE, GROUP A STREP (THRC)

## 2018-01-22 ENCOUNTER — Encounter (HOSPITAL_COMMUNITY): Payer: Self-pay

## 2018-01-22 ENCOUNTER — Other Ambulatory Visit: Payer: Self-pay

## 2018-01-22 ENCOUNTER — Emergency Department (HOSPITAL_COMMUNITY)
Admission: EM | Admit: 2018-01-22 | Discharge: 2018-01-22 | Disposition: A | Discharge status: Home / Self Care | Payer: Medicaid Other | Attending: Emergency Medicine | Admitting: Emergency Medicine

## 2018-01-22 ENCOUNTER — Emergency Department (HOSPITAL_COMMUNITY): Payer: Medicaid Other

## 2018-01-22 DIAGNOSIS — F17210 Nicotine dependence, cigarettes, uncomplicated: Secondary | ICD-10-CM | POA: Diagnosis not present

## 2018-01-22 DIAGNOSIS — Z79899 Other long term (current) drug therapy: Secondary | ICD-10-CM | POA: Diagnosis not present

## 2018-01-22 DIAGNOSIS — E039 Hypothyroidism, unspecified: Secondary | ICD-10-CM | POA: Diagnosis not present

## 2018-01-22 DIAGNOSIS — R0789 Other chest pain: Secondary | ICD-10-CM | POA: Diagnosis present

## 2018-01-22 DIAGNOSIS — J9801 Acute bronchospasm: Secondary | ICD-10-CM | POA: Insufficient documentation

## 2018-01-22 LAB — BASIC METABOLIC PANEL
Anion gap: 11 (ref 5–15)
BUN: <5 mg/dL — ABNORMAL LOW (ref 6–20)
CALCIUM: 9.5 mg/dL (ref 8.9–10.3)
CO2: 25 mmol/L (ref 22–32)
CREATININE: 0.86 mg/dL (ref 0.44–1.00)
Chloride: 103 mmol/L (ref 98–111)
GFR calc Af Amer: >60 mL/min (ref >60)
GLUCOSE: 89 mg/dL (ref 70–99)
Potassium: 4.3 mmol/L (ref 3.5–5.1)
Sodium: 139 mmol/L (ref 135–145)

## 2018-01-22 LAB — HEPATIC FUNCTION PANEL
ALK PHOS: 61 U/L (ref 38–126)
ALT: 13 U/L (ref 0–44)
AST: 20 U/L (ref 15–41)
Albumin: 3.8 g/dL (ref 3.5–5.0)
BILIRUBIN TOTAL: 0.8 mg/dL (ref 0.3–1.2)
Total Protein: 7.3 g/dL (ref 6.5–8.1)

## 2018-01-22 LAB — URINALYSIS, ROUTINE W REFLEX MICROSCOPIC
BILIRUBIN URINE: NEGATIVE
Glucose, UA: NEGATIVE mg/dL
HGB URINE DIPSTICK: NEGATIVE
KETONES UR: NEGATIVE mg/dL
Leukocytes, UA: NEGATIVE
Nitrite: NEGATIVE
PROTEIN: NEGATIVE mg/dL
SPECIFIC GRAVITY, URINE: 1.016 (ref 1.005–1.030)
pH: 5 (ref 5.0–8.0)

## 2018-01-22 LAB — I-STAT TROPONIN, ED: TROPONIN I, POC: 0 ng/mL (ref 0.00–0.08)

## 2018-01-22 LAB — I-STAT BETA HCG BLOOD, ED (MC, WL, AP ONLY): I-stat hCG, quantitative: <5 m[IU]/mL (ref <5)

## 2018-01-22 LAB — CBC
HCT: 40.7 % (ref 36.0–46.0)
Hemoglobin: 13.3 g/dL (ref 12.0–15.0)
MCH: 31.4 pg (ref 26.0–34.0)
MCHC: 32.7 g/dL (ref 30.0–36.0)
MCV: 96.2 fL (ref 78.0–100.0)
PLATELETS: 443 10*3/uL — AB (ref 150–400)
RBC: 4.23 MIL/uL (ref 3.87–5.11)
RDW: 13.6 % (ref 11.5–15.5)
WBC: 11 10*3/uL — ABNORMAL HIGH (ref 4.0–10.5)

## 2018-01-22 LAB — LIPASE, BLOOD: Lipase: 31 U/L (ref 11–51)

## 2018-01-22 MED ORDER — ALBUTEROL SULFATE (2.5 MG/3ML) 0.083% IN NEBU
5.0000 mg | INHALATION_SOLUTION | Freq: Once | RESPIRATORY_TRACT | Status: AC
  Administered 2018-01-22: 5 mg via RESPIRATORY_TRACT
  Filled 2018-01-22: qty 6

## 2018-01-22 MED ORDER — ALBUTEROL SULFATE HFA 108 (90 BASE) MCG/ACT IN AERS
1.0000 | INHALATION_SPRAY | RESPIRATORY_TRACT | Status: DC | PRN
  Administered 2018-01-22: 1 via RESPIRATORY_TRACT
  Filled 2018-01-22: qty 6.7

## 2018-01-22 MED ORDER — LORATADINE 10 MG PO TABS: 10.0000 mg | ORAL_TABLET | Freq: Every day | ORAL | 0 refills | Status: AC

## 2018-01-22 MED ORDER — LORATADINE 10 MG PO TABS: 10.0000 mg | ORAL_TABLET | Freq: Every day | ORAL | 0 refills | Status: DC

## 2018-01-22 NOTE — ED Notes (Signed)
Patient transported to X-ray 

## 2018-01-22 NOTE — ED Notes (Signed)
Pt back from X-ray.  

## 2018-01-22 NOTE — ED Notes (Signed)
Main lab to add on hepatic function panel and lipase 

## 2018-01-22 NOTE — ED Provider Notes (Signed)
[facility] EMERGENCY DEPARTMENT Provider Note   CSN: [id number] Arrival date & time: 01/22/18  1737     History   Chief Complaint Chief Complaint  Patient presents with  . Chest Pain    HPI Christy Obrien is a 37 y.o. female.  HPI Patient states she was smoking a "black and mild" when she began having left upper abdominal pain radiating to her left thoracic back.  States the pain is now completely resolved.  She endorses some shortness of breath and chest tightness which she states is been present for the past 2 days.  No cough, fever or chills.  States she does have a history of reactive airway disease. Past Medical History:  Diagnosis Date  . ADD (attention deficit disorder with hyperactivity)   . Anxiety    general anxiety disorder  . Asthma   . Bipolar 1 disorder ([place])   . Gastritis   . High cholesterol   . Hypothyroid    post ablation for graves  . Kidney stones   . Migraines     Patient Active Problem List   Diagnosis Date Noted  . Genital warts 10/04/2012  . White vaginal discharge 09/06/2012  . Cough 04/07/2011  . Bronchitis, acute 03/06/2011  . Migraines 12/16/2010  . Bipolar 2 disorder ([place]) 12/16/2010  . Hypothyroid 12/16/2010  . Hyperlipidemia 12/16/2010  . ADD (attention deficit disorder) 12/16/2010  . Obesity 12/16/2010    Past Surgical History:  Procedure Laterality Date  . ABLATION COLPOCLESIS    . CHOLECYSTECTOMY    . DILITATION & CURRETTAGE/HYSTROSCOPY WITH ESSURE    . EXPLORATORY LAPAROTOMY    . partial pancriatectomy    . spleenectomy    . spleenectomy    . TUBAL LIGATION       OB History    Gravida  4   Para  1   Term  1   Preterm  0   AB  3   Living  1     SAB  3   TAB  0   Ectopic  0   Multiple  0   Live Births           Obstetric Comments  Pt does not have custody of her child due to her mental illness.         Home Medications    Prior to Admission medications   Medication  Sig Start Date End Date Taking? Authorizing Provider  albuterol (PROVENTIL HFA;VENTOLIN HFA) 108 (90 BASE) MCG/ACT inhaler Inhale 2 puffs into the lungs every 4 (four) hours as needed for wheezing or shortness of breath.    [provider]  ARIPiprazole ER (ABILIFY MAINTENA) 400 MG PRSY Inject 400 mg into the muscle every 30 (thirty) days. 12/19/13   [provider]  dextroamphetamine (DEXEDRINE SPANSULE) 15 MG 24 hr capsule Take 45 mg by mouth daily. 04/24/16   [provider]  DM-Doxylamine-Acetaminophen (NYQUIL HBP COLD & FLU) 15-6.25-325 MG/15ML LIQD Take 30 mLs by mouth at bedtime as needed (pain).    [provider]  levothyroxine (SYNTHROID, LEVOTHROID) 125 MCG tablet Take 125 mcg by mouth daily before breakfast.    [provider]  loratadine (CLARITIN) 10 MG tablet Take 1 tablet (10 mg total) by mouth daily. 01/22/18   Julianne Rice, MD  topiramate (TOPAMAX) 25 MG tablet Take 50 mg by mouth at bedtime as needed (mania).  01/02/17   [provider]    Family History Family History  Problem Relation Age of Onset  . Hyperlipidemia Mother   . Hyperlipidemia Father   . Cancer Paternal Grandmother        breast    Social History Social History   Tobacco Use  . Smoking status: Current Some Day Smoker    Packs/day: 1.00    Years: 10.00    Pack years: 10.00    Types: Cigarettes    Start date: 08/26/2001  . Smokeless tobacco: Never Used  . Tobacco comment: still smoking  Substance Use Topics  . Alcohol use: No  . Drug use: Yes    Types: Marijuana     Allergies   Doxycycline; Atorvastatin; Barium sulfate; Ivp dye [iodinated diagnostic agents]; Metrizamide; Morphine and related; Other; Penicillins; Tizanidine; and Lamictal [lamotrigine]   Review of Systems Review of Systems  Constitutional: Negative for chills and fever.  HENT: Negative for sinus pressure and trouble swallowing.   Eyes: Negative for visual disturbance.    Respiratory: Positive for chest tightness and shortness of breath.   Cardiovascular: Negative for chest pain, palpitations and leg swelling.  Gastrointestinal: Positive for abdominal pain. Negative for constipation, diarrhea, nausea and vomiting.  Genitourinary: Negative for dysuria, flank pain, frequency, hematuria and pelvic pain.  Musculoskeletal: Positive for back pain and myalgias. Negative for neck pain and neck stiffness.  Skin: Negative for rash and wound.  Neurological: Negative for dizziness, weakness, light-headedness, numbness and headaches.  All other systems reviewed and are negative.    Physical Exam Updated Vital Signs BP 102/70   Pulse 72   Temp 98.1 F (36.7 C) (Oral)   Resp 16   Ht 5\' 1"  (1.549 m)   Wt 89.8 kg (198 lb)   SpO2 100%   BMI 37.41 kg/m   Physical Exam  Constitutional: She is oriented to person, place, and time. She appears well-developed and well-nourished. No distress.  HENT:  Head: Normocephalic and atraumatic.  Mouth/Throat: Oropharynx is clear and moist. No oropharyngeal exudate.  Eyes: Pupils are equal, round, and reactive to light. EOM are normal.  Neck: Normal range of motion. Neck supple. No JVD present.  Cardiovascular: Normal rate and regular rhythm. Exam reveals no gallop and no friction rub.  No murmur heard. Pulmonary/Chest: Effort normal and breath sounds normal. No stridor. No respiratory distress. She has no wheezes. She has no rales. She exhibits no tenderness.  Diminished breath sounds throughout.  Abdominal: Soft. Bowel sounds are normal. There is no tenderness. There is no rebound and no guarding.  Musculoskeletal: Normal range of motion. She exhibits no edema or tenderness.  No CVA tenderness bilaterally.  Lymphadenopathy:    She has no cervical adenopathy.  Neurological: She is alert and oriented to person, place, and time.  Moving all extremities without focal deficit.  Sensation intact.  Skin: Skin is warm and dry.  Capillary refill takes less than 2 seconds. No rash noted. She is not diaphoretic. No erythema.  Psychiatric: She has a normal mood and affect. Her behavior is normal.  Nursing note and vitals reviewed.    ED Treatments / Results  Labs (all labs ordered are listed, but only abnormal results are displayed) Labs Reviewed  BASIC METABOLIC PANEL - Abnormal; Notable for the following components:      Result Value   BUN <5 (*)    All other components within normal limits  CBC - Abnormal; Notable for the following components:   WBC 11.0 (*)    Platelets 443 (*)    All other components within  normal limits  URINALYSIS, ROUTINE W REFLEX MICROSCOPIC - Abnormal; Notable for the following components:   APPearance HAZY (*)    All other components within normal limits  LIPASE, BLOOD  HEPATIC FUNCTION PANEL  I-STAT TROPONIN, ED  I-STAT BETA HCG BLOOD, ED ([facility], [facility], [facility] ONLY)    EKG EKG Interpretation  Date/Time:  Friday January 22 2018 17:49:08 EDT Ventricular Rate:  72 PR Interval:    QRS Duration: 90 QT Interval:  416 QTC Calculation: 456 R Axis:   46 Text Interpretation:  Sinus rhythm Low voltage, precordial leads Confirmed by Julianne Rice [phone]) on 01/22/2018 6:10:06 PM   Radiology Dg Chest 2 View  Result Date: 01/22/2018 CLINICAL DATA:  Chest pain EXAM: CHEST - 2 VIEW COMPARISON:  06/08/2017 FINDINGS: The heart size and mediastinal contours are within normal limits. Both lungs are clear. The visualized skeletal structures are unremarkable. IMPRESSION: No active cardiopulmonary disease. Electronically Signed   By: Donavan Foil M.D.   On: 01/22/2018 19:10    Procedures Procedures (including critical care time)  Medications Ordered in ED Medications  albuterol (PROVENTIL) (2.5 MG/3ML) 0.083% nebulizer solution 5 mg (5 mg Nebulization Given 01/22/18 1913)     Initial Impression / Assessment and Plan / ED Course  I have reviewed the triage vital signs and the nursing  notes.  Pertinent labs & imaging results that were available during my care of the patient were reviewed by me and considered in my medical decision making (see chart for details).    Patient states chest tightness and breathing is improved after nebulized treatment.  Suspect symptoms related to bronchospasm.  Advised not to smoke.  States she does not tolerate prednisone well.  Given MDI in the emergency department.  We will also start on Claritin.  Low suspicion for CAD/PE.  Return precautions given. Final Clinical Impressions(s) / ED Diagnoses   Final diagnoses:  Atypical chest pain  Bronchospasm    ED Discharge Orders        Ordered    loratadine (CLARITIN) 10 MG tablet  Daily,   Status:  Discontinued     01/22/18 2109    loratadine (CLARITIN) 10 MG tablet  Daily     01/22/18 2122       Julianne Rice, MD 01/23/18 1538

## 2018-01-22 NOTE — ED Triage Notes (Signed)
Pt from home with ems c.o left sided CP that started today and it radiates to her left flank and back. Pain was a 4/10 initially, pt given 324 ASA and pain is now 1/10. Pt a.o, NSR on EKG VSS, nad

## 2018-08-27 IMAGING — CR DG CHEST 2V
2 series · 2 of 2 positions shown · non-contrast
Comparison: 06/04/2016

CLINICAL DATA: Cough with shortness of breath.

EXAM:
CHEST  2 VIEW

[chest pa]
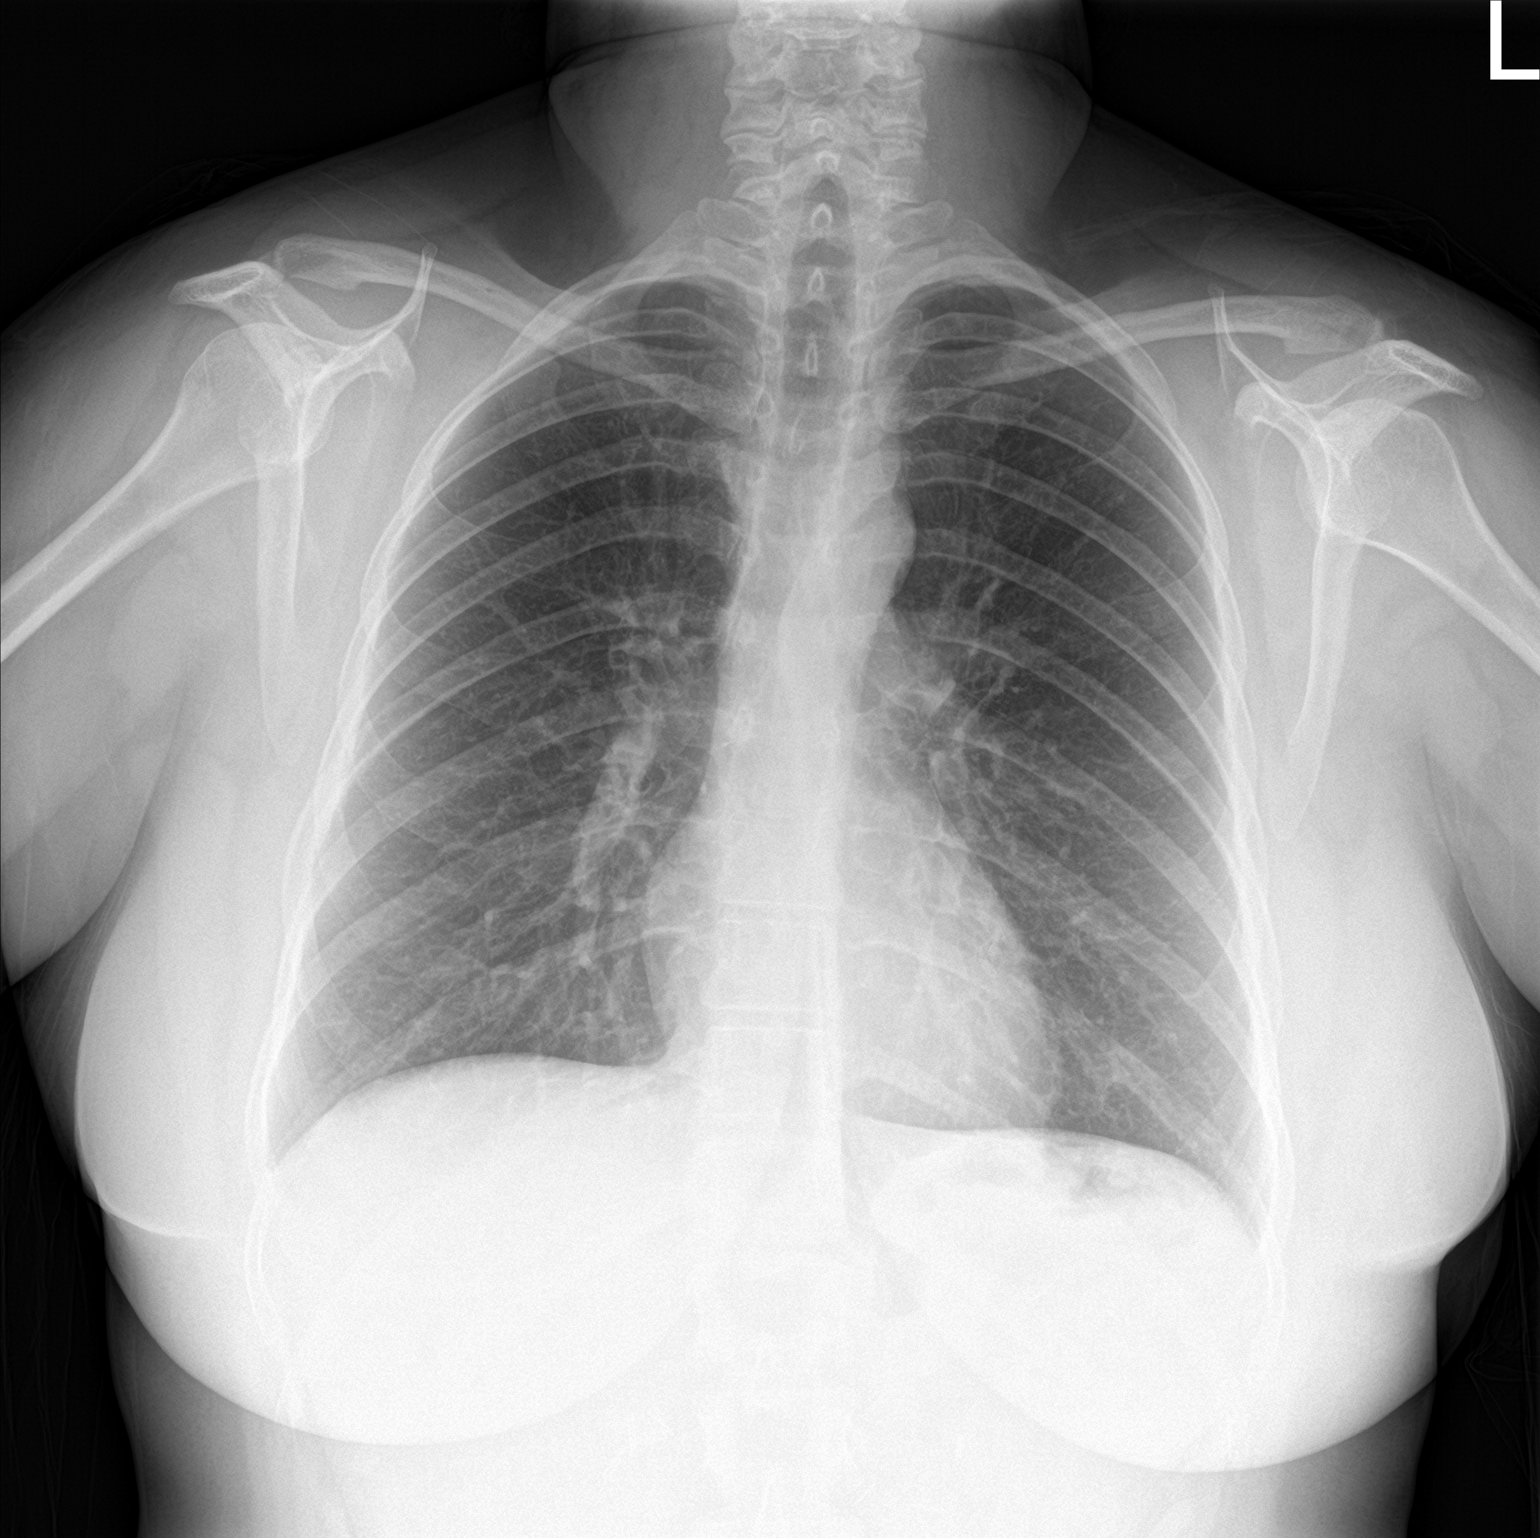

[chest lat]
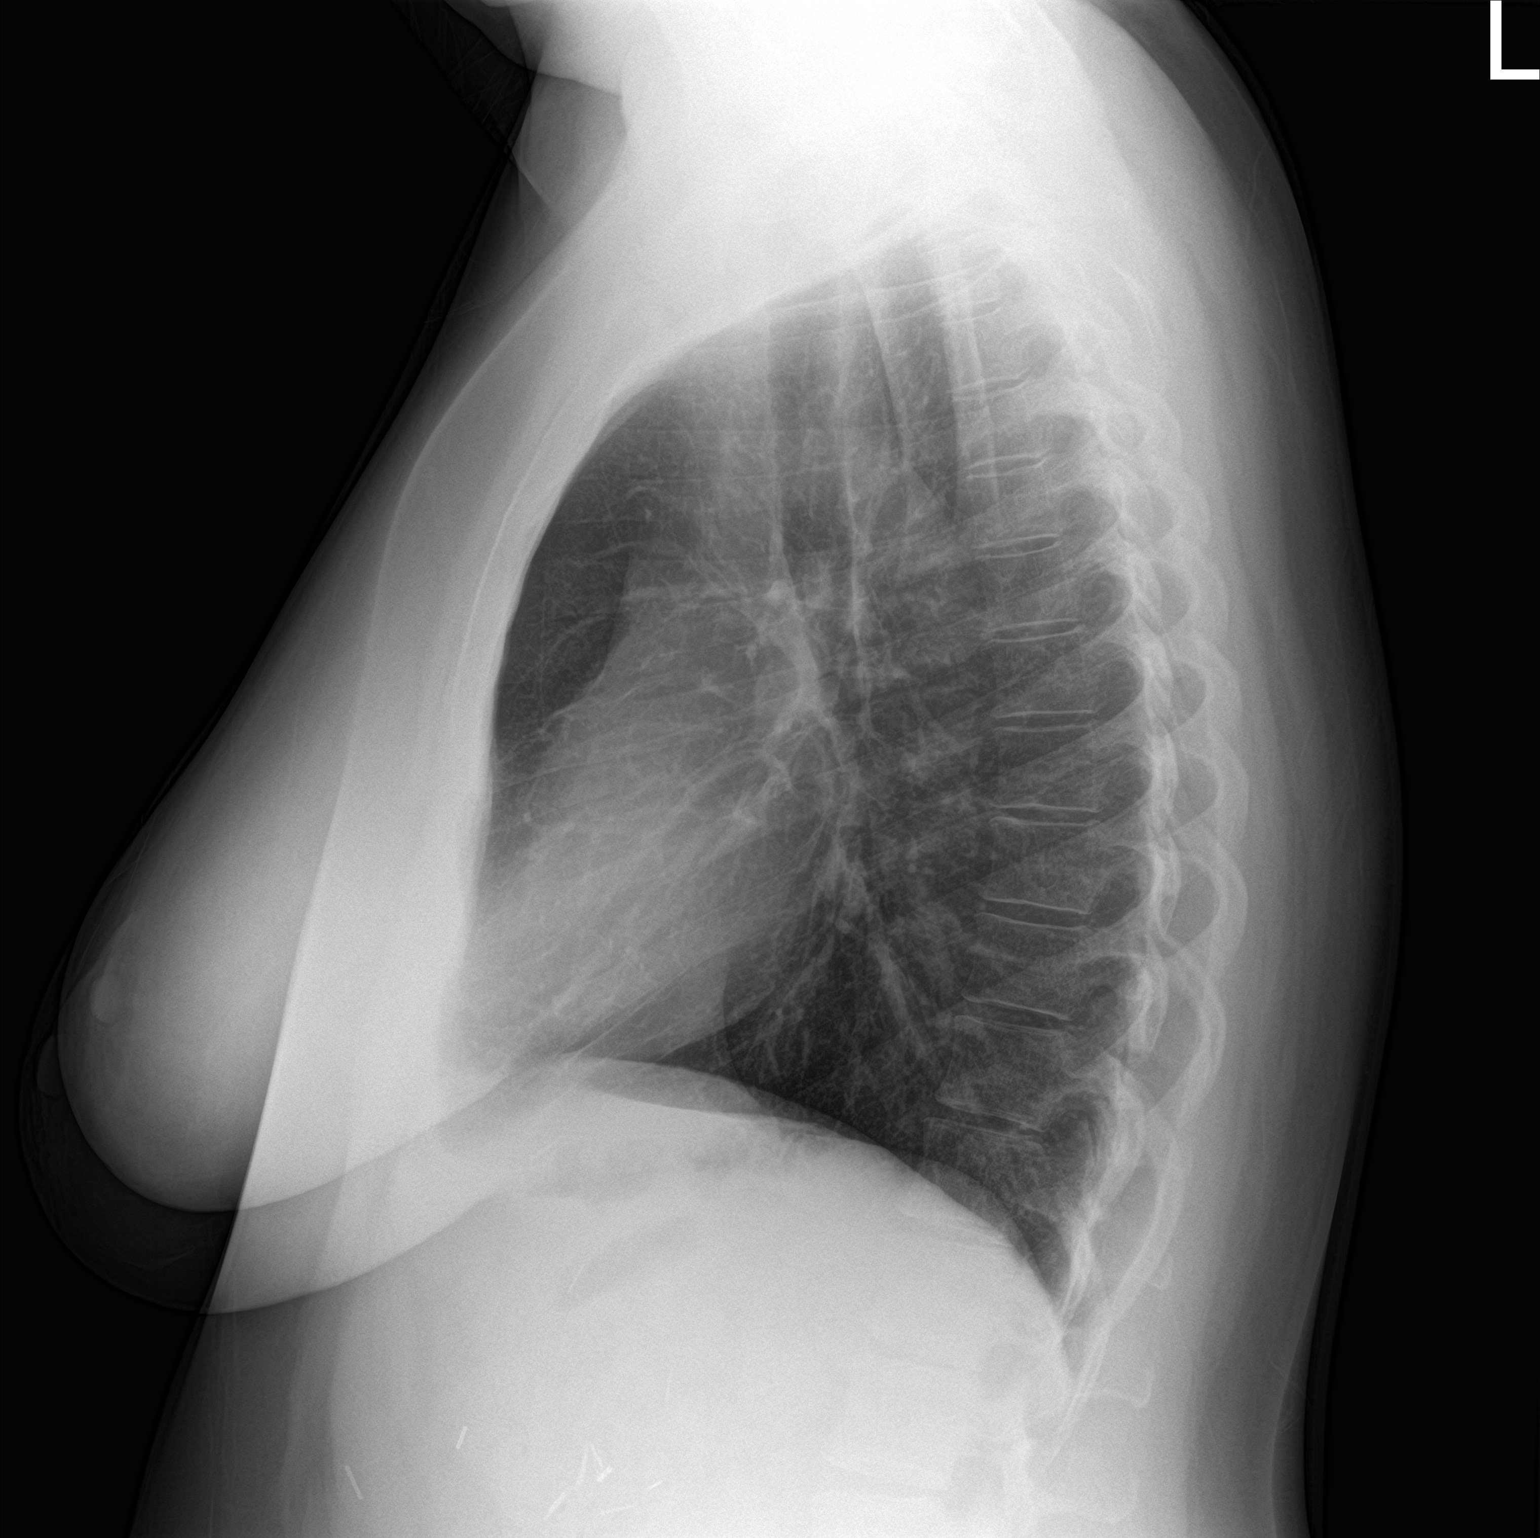

[2 of 2 positions shown; findings below may reference images not displayed]

FINDINGS: The lungs are clear without focal pneumonia, edema, pneumothorax or
pleural effusion. The cardiopericardial silhouette is within normal
limits for size. The visualized bony structures of the thorax are
intact.
IMPRESSION: Normal exam.
